# Patient Record
Sex: Female | Born: 1970 | Hispanic: No | Marital: Married | Smoking: Never smoker
Health system: Southern US, Community
[De-identification: ages and names within clinical notes are randomized; demographics above are authoritative.]

## PROBLEM LIST (undated history)

## (undated) DIAGNOSIS — E039 Hypothyroidism, unspecified: Secondary | ICD-10-CM

## (undated) DIAGNOSIS — J209 Acute bronchitis, unspecified: Secondary | ICD-10-CM

## (undated) DIAGNOSIS — R0600 Dyspnea, unspecified: Secondary | ICD-10-CM

## (undated) DIAGNOSIS — D649 Anemia, unspecified: Secondary | ICD-10-CM

## (undated) HISTORY — DX: Acute bronchitis, unspecified: J20.9

## (undated) HISTORY — PX: FRACTURE SURGERY: SHX138

## (undated) HISTORY — PX: TONSILLECTOMY: SUR1361

---

## 2004-07-31 ENCOUNTER — Ambulatory Visit: Payer: Self-pay | Admitting: Internal Medicine

## 2004-12-15 ENCOUNTER — Other Ambulatory Visit: Admission: RE | Admit: 2004-12-15 | Discharge: 2004-12-15 | Payer: Self-pay | Admitting: Obstetrics and Gynecology

## 2006-03-16 ENCOUNTER — Other Ambulatory Visit: Admission: RE | Admit: 2006-03-16 | Discharge: 2006-03-16 | Payer: Self-pay | Admitting: Obstetrics and Gynecology

## 2008-07-03 ENCOUNTER — Inpatient Hospital Stay (HOSPITAL_COMMUNITY): Admission: AD | Admit: 2008-07-03 | Discharge: 2008-07-07 | Payer: Self-pay | Admitting: Obstetrics and Gynecology

## 2008-07-08 ENCOUNTER — Encounter: Admission: RE | Admit: 2008-07-08 | Discharge: 2008-08-07 | Payer: Self-pay | Admitting: Obstetrics and Gynecology

## 2008-08-08 ENCOUNTER — Encounter: Admission: RE | Admit: 2008-08-08 | Discharge: 2008-09-04 | Payer: Self-pay | Admitting: Obstetrics and Gynecology

## 2010-07-14 ENCOUNTER — Ambulatory Visit: Payer: Self-pay | Admitting: Internal Medicine

## 2010-07-14 DIAGNOSIS — J209 Acute bronchitis, unspecified: Secondary | ICD-10-CM

## 2010-07-14 HISTORY — DX: Acute bronchitis, unspecified: J20.9

## 2010-09-20 DIAGNOSIS — N643 Galactorrhea not associated with childbirth: Secondary | ICD-10-CM | POA: Insufficient documentation

## 2010-10-20 NOTE — Assessment & Plan Note (Signed)
Summary: FEVER--SORE THROAT  COUGH  PER SPOUSE D/T-SDR-STC   Vital Signs:  Patient profile:   40 year old female Height:      62.5 inches Weight:      168 pounds BMI:     30.35 Temp:     98.9 degrees F oral Pulse rate:   112 / minute Pulse rhythm:   regular Resp:     16 per minute BP sitting:   114 / 80  (left arm) Cuff size:   regular  Vitals Entered By: Lanier Prude, CMA(AAMA) (July 14, 2010 9:39 AM) CC: fever,sore throat, cough, ear pain X 4 days   CC:  fever, sore throat, cough, and ear pain X 4 days.  History of Present Illness: The patient presents with complaints of sore throat, fever, cough, sinus congestion and drainge of several days duration. Not better with OTC meds. Chest hurts with coughing. Can't sleep due to cough. Muscle aches are present.  The mucus is colored.  Not seen >3 years  Preventive Screening-Counseling & Management  Alcohol-Tobacco     Smoking Status: never  Current Medications (verified): 1)  None  Allergies (verified): No Known Drug Allergies  Past History:  Past Medical History: Unremarkable  Social History: Occupation: Psychiatric nurse Married Never Smoked Smoking Status:  never  Review of Systems       The patient complains of fever and prolonged cough.    Physical Exam  General:  NAD Mouth:  Erythematous throat and intranasal mucosa c/w URI  Lungs:  Normal respiratory effort, chest expands symmetrically. Lungs are clear to auscultation, no crackles or wheezes. Heart:  Normal rate and regular rhythm. S1 and S2 normal without gallop, murmur, click, rub or other extra sounds. Abdomen:  Bowel sounds positive,abdomen soft and non-tender without masses, organomegaly or hernias noted. Skin:  Intact without suspicious lesions or rashes Psych:  Cognition and judgment appear intact. Alert and cooperative with normal attention span and concentration. No apparent delusions, illusions, hallucinations   Impression &  Recommendations:  Problem # 1:  BRONCHITIS, ACUTE (ICD-466.0) Assessment New  Her updated medication list for this problem includes:    Zithromax Z-pak 250 Mg Tabs (Azithromycin) .Marland Kitchen... As dirrected    Promethazine-codeine 6.25-10 Mg/64ml Syrp (Promethazine-codeine) .Marland Kitchen... 5-10 ml by mouth q id as needed cough  Complete Medication List: 1)  Zithromax Z-pak 250 Mg Tabs (Azithromycin) .... As dirrected 2)  Promethazine-codeine 6.25-10 Mg/23ml Syrp (Promethazine-codeine) .... 5-10 ml by mouth q id as needed cough  Patient Instructions: 1)  Use over-the-counter medicines for "cold": Tylenol  650mg  or Advil 400mg  every 6 hours  for fever; Delsym or Robutussin for cough. Mucinex or Mucinex D for congestion. Ricola or Halls for sore throat. Office visit if not better or if worse.  Prescriptions: PROMETHAZINE-CODEINE 6.25-10 MG/5ML SYRP (PROMETHAZINE-CODEINE) 5-10 ml by mouth q id as needed cough  #300 ml x 0   Entered and Authorized by:   Tresa Garter MD   Signed by:   Tresa Garter MD on 07/14/2010   Method used:   Print then Give to Patient   RxID:   [id number] ZITHROMAX Z-PAK 250 MG TABS (AZITHROMYCIN) as dirrected  #1 x 0   Entered and Authorized by:   Tresa Garter MD   Signed by:   Tresa Garter MD on 07/14/2010   Method used:   Print then Give to Patient   RxID:   [id number]    Orders Added: 1)  New Patient Level II [  99202] 

## 2010-11-13 ENCOUNTER — Other Ambulatory Visit: Payer: Self-pay | Admitting: Obstetrics and Gynecology

## 2010-11-13 DIAGNOSIS — N6452 Nipple discharge: Secondary | ICD-10-CM

## 2010-11-18 ENCOUNTER — Ambulatory Visit
Admission: RE | Admit: 2010-11-18 | Discharge: 2010-11-18 | Disposition: A | Discharge status: Home / Self Care | Payer: BC Managed Care – PPO | Source: Ambulatory Visit | Attending: Obstetrics and Gynecology | Admitting: Obstetrics and Gynecology

## 2010-11-18 DIAGNOSIS — N6452 Nipple discharge: Secondary | ICD-10-CM

## 2011-02-02 NOTE — H&P (Signed)
NAMETIFFNY, GEMMER             ACCOUNT NO.:  [id number]   MEDICAL RECORD NO.:  [id number]          PATIENT TYPE:  INP   LOCATION:  9171                          FACILITY:  [facility]   PHYSICIAN:  Osborn Coho, M.D.   DATE OF BIRTH:  1971/06/29   DATE OF ADMISSION:  07/03/2008  DATE OF DISCHARGE:                              HISTORY & PHYSICAL   A 40 year old gravida 1, para 0 at 39-5/7 weeks who presents with  leaking fluid since 11 p.m.  She has some mild contractions and positive  fetal movement.  Pregnancy has been followed by Dr. Pennie Rushing remarkable  for:  1. Language barrier.  2. AMA.  3. Elevated BMI.  4. History of thyroid surgery.  5. Group B strep negative.   ALLERGIES:  None.   OB HISTORY:  None.   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  She has a history of thyroid surgery at age 89, but no medications  required.  She has a history of scoliosis.   SURGICAL HISTORY:  Remarkable for thyroid surgery at age 64.   FAMILY HISTORY:  Remarkable for an uncle with heart disease.  Mother  with hypertension and varicosities.  Mother and grandmother with anemia  nephew with asthma.  Several family members with diabetes.   GENETIC HISTORY:  Remarkable for the patient's age of 85.   SOCIAL HISTORY:  The patient is married to [place]serrat who is involved and  supportive.  She is of the [place] faith.  She denies any alcohol,  tobacco, or drug use.   PRENATAL LABS:  Hemoglobin 12.5 and platelets 205.  Blood type AB  positive, antibody screen negative, sickle cell negative, RPR  nonreactive, rubella immune, hepatitis negative, HIV negative, Pap test  normal, gonorrhea negative, and chlamydia negative.  TSH 0.47.  Cystic  fibrosis negative.   HISTORY OF CURRENT PREGNANCY:  The patient entered care at [redacted] weeks  gestation.  She had first trimester screen that was normal.  She had  some palpitations in second trimester, but declined workup.  She had  ultrasound at 18 weeks that was normal.  Glucola at 26 weeks was 124 and  she was group B strep negative at term.   OBJECTIVE DATA:  VITAL SIGNS:  Stable, afebrile.  HEENT: Within normal limits.  NECK:  Thyroid normal, not enlarged.  CHEST:  Clear to auscultation.  HEART:  Regular rate and rhythm.  ABDOMEN: Gravid.  VERTEX:  Leopold's exam shows a reactive fetal heart rate with  contractions every 3 to 8 minutes.  Cervix is 185, -1, vertex  presentation with clear fluid leaking copiously.  EXTREMITIES:  Within normal limits.   ASSESSMENT:  1. Intrauterine pregnancy at 39-5/7 weeks.  2. Early labor.  3. Spontaneous rupture of membranes.  Clear fluid.   PLAN:  1. Admit per Dr. Su Hilt.  2. Routine MD orders.  3. Expectant management  4. Epidural p.r.n.      Marie L. Williams, C.N.M.      Osborn Coho, M.D.  Electronically Signed    MLW/MEDQ  D:  07/03/2008  T:  07/03/2008  Job:  [id number]

## 2011-02-02 NOTE — Discharge Summary (Signed)
Katie Mclaughlin, Katie Mclaughlin             ACCOUNT NO.:  [id number]   MEDICAL RECORD NO.:  [id number]          PATIENT TYPE:  INP   LOCATION:  9102                          [facility]ILITY:  [facility]   PHYSICIAN:  Hal Morales, M.D.DATE OF BIRTH:  November 17, 1970   DATE OF ADMISSION:  07/03/2008  DATE OF DISCHARGE:  07/07/2008                               DISCHARGE SUMMARY   ADMITTING DIAGNOSES:  1. Intrauterine pregnancy at 39-5/7 weeks.  2. Early labor.  3. Spontaneous rupture of membranes for clear fluid.   DISCHARGE DIAGNOSES:  1. Stable status post a primary low transverse cesarean section      secondary to failure to progress with findings of a viable female      infant by the name of Maximus who was born July 04, 2007, at 1      minute after midnight, weighing 7 pounds 13 ounces (3555 g), weight      21 inches, Apgars 9 at one minute and 9 at five minutes in LOP      presentation.  2. Breast feeding.   HOSPITAL PROCEDURES:  1. Epidural anesthesia.  2. Primary low transverse cesarean section.   HOSPITAL COURSE:  Ms. Petree is a 40 year old gravida 1, para 0 at 73-  5/[redacted] weeks gestation who presents with leaking of fluid since 11:00 p.m.  She reported some mild contractions, good fetal movement.  Pregnancy had  been followed by Dr. Pennie Rushing at [facility].  Pregnancy had  been remarkable for:   1. Language barrier.  2. Advanced maternal age.  3. Elevated BMI.  4. History of thyroid surgery.  5. Group beta strep negative.   At time of admission contractions were every 3-8 minutes.  Cervix was 1  cm, 85%, -1, vertex presentation and was leaking copious amount of clear  fluid.  She was admitted to birthing suite for expectant management per  Dr. Su Hilt and to receive routine orders and an epidural p.r.n.  Just  before around 1:00 a.m. the patient was noted to have two variable  decelerations with nadir to 100 with double contraction pattern.  D  cells resolved with the  usual measures after uterine contractions were  over and IV started and the patient was continued to be observed  closely.  At noon on October 14 the patient was comfortable status post  an epidural.  Her vital signs were stable.  Fetal heart rate was  reassuring.  Cervical exam revealed 3-4 cm, 80%, -1.  Pitocin had been  initiated previously and was to continue.  Around 6:30 p.m. on October  14 the patient had a hot spot with her epidural.  Fetal heart rate was  reassuring.  Cervical exam revealed 6 cm, 100% and -1 station.  IUPC was  placed at that time for improved Pitocin management and epidural was to  be reassessed.  At 10:00 p.m. on October 14 Dr. Pennie Rushing noted that MVUs  had been anywhere from 180-300 but that there had been no consistent 2  hours of adequate contractions.  Cervix remained unchanged at 6 cm.  Fetal heart rate was reactive, however,  the baseline had elevated up to  160s and 170s.  The patient's temperature was 99 degrees.  A C-section  was recommended to the patient for failure to progress.  The risks,  benefits and alternatives were discussed and risks including but not  limited to bleeding, infection and damage to adjacent organs was  reviewed with the patient.  The patient at that time did desire to  continue to wait to see if cervix did progress and plan was made to  recheck cervix at 11:00 p.m.  At 11:00 p.m. the patient continued to be  uncomfortable after anesthesia I guess had been reassessed and redosed.  MVUs greater than 180.  Cervix was 7 cm, 100%, -1.  Fetal heart rate  with recurrent early relief with contractions.  She at that time did  verbalize desire to proceed with a C-section and the patient was prepped  and taken to OR where a primary low transverse cesarean section was  performed secondary to failure to progress by Dr. Dierdre Forth with  Wynelle Bourgeois, certified nurse midwife assisting.  Procedure was  performed under epidural anesthesia.   Findings were a viable female infant  by the name of Maximus at 1 minute after midnight, weighing 7 pounds 13  ounces with Apgars of 9 and 9.  EBL was approximately 750 mL.  Placenta  was to L and D.  The patient tolerated the procedure well and was taken  to PACU in good condition.   Postoperative day #0 the patient was doing well, had minimal pain.  Foley was still in that morning.  On assessment she was breast feeding.  Vital signs were stable.  Her physical exam was within normal limits.  She had scant lochia.  Incision was clean, dry and intact.  Extremities  within normal limits.  Hemoglobin was down to 10.7 from 11.6 and white  blood cell count was up to 13.7 from 7.7 and platelets were 175 down  from 192.  Routine postoperative care was continued.  On postoperative  #1 the patient continued doing well.  Physical exam was within normal  limits.  She was afebrile.  Vital signs were stable and initially were  anticipating possible early discharge on July 06, 2008.  By  postoperative day #2 on October 17, however, the patient was feeling  well but did desire to complete 3 day stay until October 18.  She was  ambulating, voiding and tolerating p.o. liquids and solids without  difficulty, positive flatus, positive BM.  She continued to work on  breast feeding.  Physical exam remained within normal limits.  Vital  signs were stable and she was afebrile.  Bowel sounds were present in  four quadrants.  Abdomen was soft, appropriately tender.  Fundus was  firm below umbilicus, small lochia rubra.  Incision was clean, dry and  intact without redness.  Routine postpartum care continued.  On the  morning of October 18 which was postop day #3 the patient was without  complaints and ready for discharge.  Physical exam remained within  normal limits and she was afebrile with vital signs stable.  She was  discharged home in stable condition and was believed to have benefitted  from hospital  stay.   DISCHARGE MEDICATIONS:  1. Motrin 600 mg p.o. q.6 hours p.r.n. pain.  2. Percocet 5/325 one to two tablets p.o. q.4-6 hours p.r.n. moderate      to severe pain.  3. Daily vitamin 1 tablet p.o. daily.  4.  Stool softener over-the-counter 1 tablet p.o. q.a.m. may repeat      dose in p.m. for constipation while on Percocet or p.r.n.   DISCHARGE FOLLOWUP:  Is to occur at [facility] in 6 weeks or p.r.n.   DISCHARGE INSTRUCTIONS:  Were per [facility] pamphlet.  Warning signs and  symptoms to report were reviewed.     Candice [place], [place]      Hal Morales, M.D.  Electronically Signed   CHS/MEDQ  D:  07/07/2008  T:  07/07/2008  Job:  [id number]

## 2011-02-02 NOTE — Op Note (Signed)
NAMESAVANHA, Katie Mclaughlin             ACCOUNT NO.:  [id number]   MEDICAL RECORD NO.:  [id number]          PATIENT TYPE:  INP   LOCATION:  9102                          [facility]ACILITY:  [facility]   PHYSICIAN:  Hal Morales, M.D.DATE OF BIRTH:  1971-05-07   DATE OF PROCEDURE:  DATE OF DISCHARGE:                               OPERATIVE REPORT   PREOPERATIVE DIAGNOSES:  Intrauterine pregnancy at term, failure to  progress in labor.   POSTOPERATIVE DIAGNOSES:  Intrauterine pregnancy at term, failure to  progress in labor.   OPERATION:  Primary low-transverse cesarean section.   SURGEON:  Vanessa P. Pennie Rushing, MD   FIRST ASSISTANT:  Elby Showers. Mayford Knife, CNM   ANESTHESIA:  Epidural.   ESTIMATED BLOOD LOSS:  750 mL.   COMPLICATIONS:  None.   FINDINGS:  The patient was delivered of a female infant whose name is  Maximus weighing 7 pounds 13 ounces with Apgars of 9 and 9 at 1 and 5  minutes respectively.  Right fundal fibroid approximately 3 cm.  The  tubes and ovaries were normal for the gravid state.   PROCEDURE:  The patient was taken to the operating room after  appropriate identification with her labor epidural and Foley catheter in  place.  She was placed on the operating table in the supine position  with a left lateral tilt.  Her labor epidural was dosed for surgical  anesthesia.  The abdomen was prepped with multiple layers of Betadine  and draped as a sterile field.  After the assurance of adequate surgical  anesthesia, a transverse area suprapubically was infiltrated with 20 mL  of 0.25% Marcaine.  A suprapubic incision was made and the abdomen  opened in layers.  The peritoneum was entered and the bladder blade  placed.  The uterus was incised approximately 2 cm above the  uterovesical fold and that incision taken laterally on either side  bluntly.  The infant was delivered from the occiput transverse position  and after having the nares and pharynx suctioned and the cord clamped  and  cut, was handed off to the awaiting pediatricians.  The appropriate  cord blood was drawn and the placenta allowed to separate from the  uterus and was removed from the operative field.  The uterine incision  was then closed with a running interlocking suture of 0 Vicryl.  An  imbricating suture of 0 Vicryl was placed with adequate hemostasis.  Copious irrigation was carried out.  The abdominal peritoneum was closed  with running suture of 2-0 Vicryl.  Rectus muscles were approximated in  the midline with the figure-of-eight suture of 2-0 Vicryl.  The fascia  was closed with a running suture of 0 Vicryl and reinforced on either  side of midline with figure-of-eight sutures of 0 Vicryl.  The  subcutaneous tissue was made hemostatic with Bovie cautery and  irrigated.  The skin incision was closed with a  subcuticular suture of 3-0 Monocryl.  A sterile dressing was applied  after Steri-Strips had been applied and the patient taken from the  operating room to the recovery room in satisfactory condition having  tolerated the procedure well with sponge and instrument counts correct.  The placenta went to birthing suites.      Hal Morales, M.D.  Electronically Signed     VPH/MEDQ  D:  07/04/2008  T:  07/04/2008  Job:  [id number]

## 2011-03-23 ENCOUNTER — Other Ambulatory Visit (INDEPENDENT_AMBULATORY_CARE_PROVIDER_SITE_OTHER): Payer: BC Managed Care – PPO

## 2011-03-23 DIAGNOSIS — Z0000 Encounter for general adult medical examination without abnormal findings: Secondary | ICD-10-CM

## 2011-03-23 LAB — CBC WITH DIFFERENTIAL/PLATELET
Basophils Relative: 0.3 % (ref 0.0–3.0)
Eosinophils Relative: 3.3 % (ref 0.0–5.0)
Lymphocytes Relative: 28.9 % (ref 12.0–46.0)
Monocytes Relative: 7.9 % (ref 3.0–12.0)
Neutrophils Relative %: 59.6 % (ref 43.0–77.0)
RBC: 4.26 Mil/uL (ref 3.87–5.11)
WBC: 7.7 10*3/uL (ref 4.5–10.5)

## 2011-03-23 LAB — BASIC METABOLIC PANEL
Calcium: 8.9 mg/dL (ref 8.4–10.5)
Chloride: 109 mEq/L (ref 96–112)
Creatinine, Ser: 0.5 mg/dL (ref 0.4–1.2)
GFR: 160.46 mL/min (ref >60.00)

## 2011-03-23 LAB — URINALYSIS
Hgb urine dipstick: NEGATIVE
Nitrite: NEGATIVE
Specific Gravity, Urine: 1.025 (ref 1.000–1.030)
Total Protein, Urine: NEGATIVE
pH: 5.5 (ref 5.0–8.0)

## 2011-03-23 LAB — LIPID PANEL
LDL Cholesterol: 121 mg/dL — ABNORMAL HIGH (ref 0–99)
Total CHOL/HDL Ratio: 4
Triglycerides: 72 mg/dL (ref 0.0–149.0)
VLDL: 14.4 mg/dL (ref 0.0–40.0)

## 2011-03-23 LAB — HEPATIC FUNCTION PANEL
ALT: 21 U/L (ref 0–35)
Bilirubin, Direct: 0.1 mg/dL (ref 0.0–0.3)
Total Bilirubin: 0.4 mg/dL (ref 0.3–1.2)

## 2011-03-25 ENCOUNTER — Encounter: Payer: Self-pay | Admitting: Internal Medicine

## 2011-03-25 ENCOUNTER — Ambulatory Visit (INDEPENDENT_AMBULATORY_CARE_PROVIDER_SITE_OTHER): Payer: BC Managed Care – PPO | Admitting: Internal Medicine

## 2011-03-25 DIAGNOSIS — E0590 Thyrotoxicosis, unspecified without thyrotoxic crisis or storm: Secondary | ICD-10-CM

## 2011-03-25 DIAGNOSIS — G43909 Migraine, unspecified, not intractable, without status migrainosus: Secondary | ICD-10-CM

## 2011-03-25 DIAGNOSIS — N6459 Other signs and symptoms in breast: Secondary | ICD-10-CM

## 2011-03-25 DIAGNOSIS — Z0000 Encounter for general adult medical examination without abnormal findings: Secondary | ICD-10-CM

## 2011-03-25 DIAGNOSIS — N6452 Nipple discharge: Secondary | ICD-10-CM

## 2011-03-25 MED ORDER — SUMATRIPTAN SUCCINATE 100 MG PO TABS: 100.0000 mg | ORAL_TABLET | Freq: Once | ORAL | Status: AC | PRN

## 2011-03-25 MED ORDER — IBUPROFEN 600 MG PO TABS: ORAL_TABLET | ORAL | Status: AC

## 2011-03-25 MED ORDER — PROMETHAZINE HCL 12.5 MG PO TABS: 12.5000 mg | ORAL_TABLET | Freq: Four times a day (QID) | ORAL | Status: AC | PRN

## 2011-04-07 ENCOUNTER — Encounter: Payer: Self-pay | Admitting: Endocrinology

## 2011-04-07 ENCOUNTER — Ambulatory Visit (INDEPENDENT_AMBULATORY_CARE_PROVIDER_SITE_OTHER): Payer: BC Managed Care – PPO | Admitting: Endocrinology

## 2011-04-07 DIAGNOSIS — E0590 Thyrotoxicosis, unspecified without thyrotoxic crisis or storm: Secondary | ICD-10-CM

## 2011-04-07 MED ORDER — METHIMAZOLE 5 MG PO TABS: 5.0000 mg | ORAL_TABLET | Freq: Every day | ORAL | Status: DC

## 2011-04-07 NOTE — Patient Instructions (Addendum)
i have sent a prescription to your pharmacy, to slow down the thyroid. if ever you have fever while taking this medication, stop it and call us, because of the risk of a rare side-effect Please make a follow-up appointment in 6 weeks. In view of your medical condition, you should avoid pregnancy until we have decided it is safe.

## 2011-04-07 NOTE — Progress Notes (Signed)
Subjective:    Patient ID: Katie Mclaughlin, female    DOB: Jul 29, 1971, 40 y.o.   MRN: [id number]  HPI Pt was noted on routine labs to have low tsh.  She says as a child, she had an overactive thyroid, and she was rx'ed with an uncertain type of medication, for a brief time.  Since then, she has not had and dx or rx, until now.  Pt states few mos of slight intermittent "spasm" in the neck, in the context of anxiety.  No assoc neck pain.   Past Medical History  Diagnosis Date  . BRONCHITIS, ACUTE 07/14/2010    No past surgical history on file.  History   Social History  . Marital Status: Married    Spouse Name: N/A    Number of Children: N/A  . Years of Education: N/A   Occupational History  . voice teacher    Social History Main Topics  . Smoking status: Never Smoker   . Smokeless tobacco: Not on file  . Alcohol Use: No  . Drug Use: No  . Sexually Active: Yes   Other Topics Concern  . Not on file   Social History Narrative  . No narrative on file    Current Outpatient Prescriptions on File Prior to Visit  Medication Sig Dispense Refill  . Calcium Carbonate (CALCIUM 500 PO) Take 1 capsule by mouth daily.        . cyanocobalamin 1000 MCG tablet Take 100 mcg by mouth daily.        . Omega-3 Fatty Acids (FISH OIL) 1000 MG CAPS Take 1 capsule by mouth daily.        . SUMAtriptan (IMITREX) 100 MG tablet Take 1 tablet (100 mg total) by mouth once as needed for migraine.  10 tablet  2    No Known Allergies  Family History  Problem Relation Age of Onset  . Diabetes Mother   . Migraines Mother   no thyroid dz BP 122/80  Pulse 78  Temp(Src) 98.8 F (37.1 C) (Oral)  Ht 5\' 4"  (1.626 m)  Wt 170 lb (77.111 kg)  BMI 29.18 kg/m2  SpO2 98%  LMP 03/05/2011    Review of Systems denies hoarseness, double vision, sob, polyuria, excessive diaphoresis, numbness, tremor, and hypoglycemia.  She has difficulty losing weight.  She reports headache, palpitations, easy bruising,  rhinorrhea, and myalgias.  she has intermittent diarrhea.    Objective:   Physical Exam VS: see vs page GEN: no distress HEAD: head: no deformity eyes: no periorbital swelling, no proptosis external nose and ears are normal mouth: no lesion seen NECK: supple, thyroid is not enlarged CHEST WALL: no deformity CV: reg rate and rhythm, no murmur ABD: abdomen is soft, nontender.  no hepatosplenomegaly.  not distended.  no hernia MUSCULOSKELETAL: muscle bulk and strength are grossly normal.  no obvious joint swelling.  gait is normal and steady EXTEMITIES: no deformity.  no ulcer on the feet.  feet are of normal color and temp.  no edema.  There is a small ecchymosis on the left great toe (recent injury) PULSES: dorsalis pedis intact bilat.  no carotid bruit NEURO:  cn 2-12 grossly intact.   readily moves all 4's.  sensation is intact to touch on the feet SKIN:  Normal texture and temperature.  No rash or suspicious lesion is visible.   NODES:  None palpable at the neck PSYCH: alert, oriented x3.  Does not appear anxious nor depressed.   Lab Results  Component  Value Date   TSH 0.05* 03/23/2011  (i discussed with dr plotnikov). Assessment & Plan:  Hyperthyroidism, uncertain etiology.  i discussed with pt and husband.  They want to minimize their expenditures for now, so they request thioinamide rx.  We discussed the causes, risks, and rx options. Neck sxs, not thyroid-related. Palpitations, and other sxs, possibly thyroid-related.

## 2011-04-10 NOTE — Assessment & Plan Note (Signed)
We discussed age appropriate health related issues, including available/recomended screening tests and vaccinations. We discussed a need for adhering to healthy diet and exercise. Labs/EKG were reviewed/ordered. All questions were answered.   

## 2011-04-10 NOTE — Progress Notes (Signed)
  Subjective:    Patient ID: Katie Mclaughlin, female    DOB: [birth year], 40 y.o.   MRN: [id number]  HPI    Review of Systems  Constitutional: Positive for unexpected weight change (wt gain). Negative for fever, chills, diaphoresis, activity change, appetite change and fatigue.  HENT: Negative for hearing loss, ear pain, nosebleeds, congestion, sore throat, facial swelling, rhinorrhea, sneezing, mouth sores, trouble swallowing, neck pain, neck stiffness, postnasal drip, sinus pressure and tinnitus.   Eyes: Negative for pain, discharge, redness, itching and visual disturbance.  Respiratory: Negative for cough, chest tightness, shortness of breath, wheezing and stridor.   Cardiovascular: Negative for chest pain, palpitations and leg swelling.  Gastrointestinal: Negative for nausea, diarrhea, constipation, blood in stool, abdominal distention, anal bleeding and rectal pain.  Genitourinary: Negative for dysuria, urgency, frequency, hematuria, flank pain, vaginal bleeding, vaginal discharge, difficulty urinating, genital sores and pelvic pain.  Musculoskeletal: Negative for back pain, joint swelling, arthralgias and gait problem.  Skin: Negative.  Negative for rash.  Neurological: Positive for headaches. Negative for dizziness, tremors, seizures, syncope, speech difficulty, weakness and numbness.  Hematological: Negative for adenopathy. Does not bruise/bleed easily.  Psychiatric/Behavioral: Negative for suicidal ideas, behavioral problems, sleep disturbance, dysphoric mood and decreased concentration. The patient is not nervous/anxious.        Objective:   Physical Exam  Constitutional: She appears well-developed and well-nourished. No distress.  HENT:  Head: Normocephalic.  Right Ear: External ear normal.  Left Ear: External ear normal.  Nose: Nose normal.  Mouth/Throat: Oropharynx is clear and moist.  Eyes: Conjunctivae are normal. Pupils are equal, round, and reactive to light. Right eye  exhibits no discharge. Left eye exhibits no discharge.  Neck: Normal range of motion. Neck supple. No JVD present. No tracheal deviation present. No thyromegaly present.  Cardiovascular: Normal rate, regular rhythm and normal heart sounds.   Pulmonary/Chest: No stridor. No respiratory distress. She has no wheezes.  Abdominal: Soft. Bowel sounds are normal. She exhibits no distension and no mass. There is no tenderness. There is no rebound and no guarding.  Musculoskeletal: She exhibits no edema and no tenderness.  Lymphadenopathy:    She has no cervical adenopathy.  Neurological: She displays normal reflexes. No cranial nerve deficit. She exhibits normal muscle tone. Coordination normal.  Skin: No rash noted. No erythema.  Psychiatric: She has a normal mood and affect. Her behavior is normal. Judgment and thought content normal.       Procedure Note :    Procedure :   Sonography examination (no charge)   Indication: Elevated TSH   Equipment used: Sonosite M-Turbo with HFL38x/13-6 MHz transducer linear probe. The images were stored in the unit and later transferred in storage.  The patient was placed in a decubitus position.  This study revealed a normal thyroid  Impression: Normal thyroid      Assessment & Plan:

## 2011-04-10 NOTE — Assessment & Plan Note (Signed)
See Meds 

## 2011-04-10 NOTE — Assessment & Plan Note (Signed)
Endocr cons Dr Everardo All

## 2011-05-19 ENCOUNTER — Ambulatory Visit: Payer: BC Managed Care – PPO | Admitting: Endocrinology

## 2011-06-22 LAB — CBC
HCT: 32 — ABNORMAL LOW
HCT: 34.2 — ABNORMAL LOW
Hemoglobin: 10.7 — ABNORMAL LOW
Hemoglobin: 11.6 — ABNORMAL LOW
MCHC: 33.4
MCV: 92.3
RBC: 3.46 — ABNORMAL LOW
RBC: 3.74 — ABNORMAL LOW
WBC: 7.7

## 2011-06-24 ENCOUNTER — Ambulatory Visit (INDEPENDENT_AMBULATORY_CARE_PROVIDER_SITE_OTHER): Payer: BC Managed Care – PPO | Admitting: Internal Medicine

## 2011-06-24 ENCOUNTER — Encounter: Payer: Self-pay | Admitting: Internal Medicine

## 2011-06-24 ENCOUNTER — Other Ambulatory Visit (INDEPENDENT_AMBULATORY_CARE_PROVIDER_SITE_OTHER): Payer: BC Managed Care – PPO

## 2011-06-24 VITALS — BP 120/80 | HR 88 | Temp 99.5°F | Resp 16 | Wt 177.0 lb

## 2011-06-24 DIAGNOSIS — R030 Elevated blood-pressure reading, without diagnosis of hypertension: Secondary | ICD-10-CM

## 2011-06-24 DIAGNOSIS — J209 Acute bronchitis, unspecified: Secondary | ICD-10-CM

## 2011-06-24 DIAGNOSIS — E049 Nontoxic goiter, unspecified: Secondary | ICD-10-CM

## 2011-06-24 DIAGNOSIS — E0590 Thyrotoxicosis, unspecified without thyrotoxic crisis or storm: Secondary | ICD-10-CM

## 2011-06-24 LAB — T4, FREE: Free T4: 0.83 ng/dL (ref 0.60–1.60)

## 2011-06-24 LAB — BASIC METABOLIC PANEL
BUN: 9 mg/dL (ref 6–23)
CO2: 28 mEq/L (ref 19–32)
Chloride: 104 mEq/L (ref 96–112)
Creatinine, Ser: 0.6 mg/dL (ref 0.4–1.2)
Potassium: 4.9 mEq/L (ref 3.5–5.1)

## 2011-06-24 MED ORDER — HYDROCHLOROTHIAZIDE 12.5 MG PO CAPS: 12.5000 mg | ORAL_CAPSULE | Freq: Every day | ORAL | Status: DC

## 2011-06-24 MED ORDER — AZITHROMYCIN 250 MG PO TABS: ORAL_TABLET | ORAL | Status: AC

## 2011-06-24 MED ORDER — METHIMAZOLE 5 MG PO TABS: 5.0000 mg | ORAL_TABLET | Freq: Every day | ORAL | Status: DC

## 2011-06-24 NOTE — Assessment & Plan Note (Signed)
Mild Microzide 1 a day

## 2011-06-24 NOTE — Progress Notes (Signed)
  Subjective:    Patient ID: Katie Mclaughlin, female    DOB: 11/28/1970, 40 y.o.   MRN: [id number]  HPI   HPI  C/o URI sx's x   20 days. C/o ST, cough, weakness. Not better with OTC medicines. Actually, the patient is getting worse  C/o elev SBP at home >130 C/o goiter - asking me to check labs  .Review of Systems  Constitutional: Positive for fever, chills and fatigue.  HENT: Positive for congestion, rhinorrhea, sneezing and postnasal drip.   Eyes: Positive for photophobia and pain. Negative for discharge and visual disturbance.  Respiratory: Positive for cough and wheezing.   Positive for chest pain.  Gastrointestinal: Negative for vomiting, abdominal pain, diarrhea and abdominal distention.  Genitourinary: Negative for dysuria and difficulty urinating.  Skin: Negative for rash.  Neurological: Positive for dizziness, weakness and light-headedness.      Review of Systems     Objective:   Physical Exam  Constitutional: She appears well-developed and well-nourished. No distress.  HENT:  Head: Normocephalic.  Right Ear: External ear normal.  Left Ear: External ear normal.  Nose: Nose normal.  Mouth/Throat: Oropharynx is clear and moist.       eryth throat  Eyes: Conjunctivae are normal. Pupils are equal, round, and reactive to light. Right eye exhibits no discharge. Left eye exhibits no discharge.  Neck: Normal range of motion. Neck supple. No JVD present. No tracheal deviation present. No thyromegaly present.  Cardiovascular: Normal rate, regular rhythm and normal heart sounds.   Pulmonary/Chest: No stridor. No respiratory distress. She has no wheezes.  Abdominal: Soft. Bowel sounds are normal. She exhibits no distension and no mass. There is no tenderness. There is no rebound and no guarding.  Musculoskeletal: She exhibits no edema and no tenderness.  Lymphadenopathy:    She has no cervical adenopathy.  Neurological: She displays normal reflexes. No cranial nerve deficit.  She exhibits normal muscle tone. Coordination normal.  Skin: No rash noted. No erythema.  Psychiatric: She has a normal mood and affect. Her behavior is normal. Judgment and thought content normal.    Lab Results  Component Value Date   WBC 7.7 03/23/2011   HGB 12.4 03/23/2011   HCT 37.0 03/23/2011   PLT 188.0 03/23/2011   GLUCOSE 110* 06/24/2011   CHOL 176 03/23/2011   TRIG 72.0 03/23/2011   HDL 40.90 03/23/2011   LDLCALC 121* 03/23/2011   ALT 21 03/23/2011   AST 15 03/23/2011   NA 140 06/24/2011   K 4.9 06/24/2011   CL 104 06/24/2011   CREATININE 0.6 06/24/2011   BUN 9 06/24/2011   CO2 28 06/24/2011   TSH 2.30 06/24/2011         Assessment & Plan:

## 2011-06-24 NOTE — Assessment & Plan Note (Signed)
Check labs. F/u w/Dr Everardo All Continue with current prescription therapy as reflected on the Med list.

## 2011-06-24 NOTE — Assessment & Plan Note (Signed)
Zpac prescription therapy as reflected on the Med list.

## 2011-06-24 NOTE — Assessment & Plan Note (Signed)
Microzide 

## 2011-06-24 NOTE — Patient Instructions (Signed)
Use over-the-counter  "cold" medicines  such as "Tylenol cold" , "Advil cold",  "Mucinex" or" Mucinex D"  for cough and congestion.   Avoid decongestants if you have high blood pressure and use "Afrin" nasal spray for nasal congestion as directed instead. Use" Delsym" or" Robitussin" cough syrup varietis for cough.  You can use plain "Tylenol" or "Advil" for fever, chills and achyness.  

## 2011-07-08 ENCOUNTER — Encounter: Payer: Self-pay | Admitting: Internal Medicine

## 2011-11-01 ENCOUNTER — Other Ambulatory Visit: Payer: Self-pay | Admitting: Obstetrics and Gynecology

## 2011-11-01 DIAGNOSIS — Z1231 Encounter for screening mammogram for malignant neoplasm of breast: Secondary | ICD-10-CM

## 2011-11-18 ENCOUNTER — Ambulatory Visit: Payer: Self-pay | Admitting: Obstetrics and Gynecology

## 2011-11-22 ENCOUNTER — Other Ambulatory Visit: Payer: Self-pay | Admitting: *Deleted

## 2011-11-22 MED ORDER — METHIMAZOLE 5 MG PO TABS: 5.0000 mg | ORAL_TABLET | Freq: Every day | ORAL | Status: DC

## 2011-12-01 ENCOUNTER — Ambulatory Visit
Admission: RE | Admit: 2011-12-01 | Discharge: 2011-12-01 | Disposition: A | Discharge status: Home / Self Care | Payer: BC Managed Care – PPO | Source: Ambulatory Visit | Attending: Obstetrics and Gynecology | Admitting: Obstetrics and Gynecology

## 2011-12-01 DIAGNOSIS — Z1231 Encounter for screening mammogram for malignant neoplasm of breast: Secondary | ICD-10-CM

## 2011-12-02 ENCOUNTER — Ambulatory Visit: Payer: Self-pay | Admitting: Obstetrics and Gynecology

## 2011-12-02 ENCOUNTER — Ambulatory Visit (INDEPENDENT_AMBULATORY_CARE_PROVIDER_SITE_OTHER): Payer: BC Managed Care – PPO | Admitting: Obstetrics and Gynecology

## 2011-12-02 DIAGNOSIS — Z01419 Encounter for gynecological examination (general) (routine) without abnormal findings: Secondary | ICD-10-CM

## 2011-12-06 ENCOUNTER — Other Ambulatory Visit: Payer: Self-pay | Admitting: Obstetrics and Gynecology

## 2011-12-06 ENCOUNTER — Ambulatory Visit: Payer: BC Managed Care – PPO | Admitting: Obstetrics and Gynecology

## 2011-12-06 DIAGNOSIS — R928 Other abnormal and inconclusive findings on diagnostic imaging of breast: Secondary | ICD-10-CM

## 2011-12-07 ENCOUNTER — Ambulatory Visit
Admission: RE | Admit: 2011-12-07 | Discharge: 2011-12-07 | Disposition: A | Discharge status: Home / Self Care | Payer: BC Managed Care – PPO | Source: Ambulatory Visit | Attending: Obstetrics and Gynecology | Admitting: Obstetrics and Gynecology

## 2011-12-07 DIAGNOSIS — R928 Other abnormal and inconclusive findings on diagnostic imaging of breast: Secondary | ICD-10-CM

## 2012-03-09 ENCOUNTER — Other Ambulatory Visit: Payer: Self-pay | Admitting: Internal Medicine

## 2012-06-13 ENCOUNTER — Other Ambulatory Visit: Payer: Self-pay | Admitting: Internal Medicine

## 2012-06-30 ENCOUNTER — Other Ambulatory Visit: Payer: Self-pay | Admitting: Internal Medicine

## 2012-06-30 DIAGNOSIS — Z0000 Encounter for general adult medical examination without abnormal findings: Secondary | ICD-10-CM

## 2012-09-16 ENCOUNTER — Other Ambulatory Visit: Payer: Self-pay | Admitting: Internal Medicine

## 2012-09-18 ENCOUNTER — Encounter: Payer: Self-pay | Admitting: Internal Medicine

## 2012-09-18 ENCOUNTER — Ambulatory Visit (INDEPENDENT_AMBULATORY_CARE_PROVIDER_SITE_OTHER): Payer: BC Managed Care – PPO | Admitting: Internal Medicine

## 2012-09-18 ENCOUNTER — Other Ambulatory Visit (INDEPENDENT_AMBULATORY_CARE_PROVIDER_SITE_OTHER): Payer: BC Managed Care – PPO

## 2012-09-18 VITALS — BP 110/76 | HR 76 | Temp 98.3°F | Resp 16 | Ht 60.0 in | Wt 173.0 lb

## 2012-09-18 DIAGNOSIS — E0590 Thyrotoxicosis, unspecified without thyrotoxic crisis or storm: Secondary | ICD-10-CM

## 2012-09-18 DIAGNOSIS — Z0000 Encounter for general adult medical examination without abnormal findings: Secondary | ICD-10-CM

## 2012-09-18 DIAGNOSIS — Z23 Encounter for immunization: Secondary | ICD-10-CM

## 2012-09-18 DIAGNOSIS — R030 Elevated blood-pressure reading, without diagnosis of hypertension: Secondary | ICD-10-CM

## 2012-09-18 DIAGNOSIS — M545 Low back pain, unspecified: Secondary | ICD-10-CM | POA: Insufficient documentation

## 2012-09-18 LAB — BASIC METABOLIC PANEL
CO2: 26 mEq/L (ref 19–32)
Calcium: 8.8 mg/dL (ref 8.4–10.5)
Creatinine, Ser: 0.7 mg/dL (ref 0.4–1.2)
GFR: 106.86 mL/min (ref >60.00)
Glucose, Bld: 90 mg/dL (ref 70–99)
Sodium: 140 mEq/L (ref 135–145)

## 2012-09-18 LAB — TSH: TSH: 1.72 u[IU]/mL (ref 0.35–5.50)

## 2012-09-18 LAB — LDL CHOLESTEROL, DIRECT: Direct LDL: 184.8 mg/dL

## 2012-09-18 LAB — URINALYSIS
Leukocytes, UA: NEGATIVE
Nitrite: NEGATIVE
Total Protein, Urine: NEGATIVE
pH: 6 (ref 5.0–8.0)

## 2012-09-18 LAB — HEPATIC FUNCTION PANEL
AST: 16 U/L (ref 0–37)
Alkaline Phosphatase: 57 U/L (ref 39–117)
Total Bilirubin: 0.7 mg/dL (ref 0.3–1.2)

## 2012-09-18 LAB — LIPID PANEL: HDL: 40 mg/dL (ref >39.00)

## 2012-09-18 MED ORDER — METHIMAZOLE 5 MG PO TABS: 5.0000 mg | ORAL_TABLET | Freq: Every day | ORAL | Status: DC

## 2012-09-18 MED ORDER — ELETRIPTAN HYDROBROMIDE 40 MG PO TABS: 40.0000 mg | ORAL_TABLET | ORAL | Status: DC | PRN

## 2012-09-18 NOTE — Assessment & Plan Note (Signed)
resolved 

## 2012-09-18 NOTE — Assessment & Plan Note (Signed)
We discussed age appropriate health related issues, including available/recomended screening tests and vaccinations. We discussed a need for adhering to healthy diet and exercise. Labs/EKG were reviewed/ordered. All questions were answered.   

## 2012-09-18 NOTE — Patient Instructions (Signed)
Wt Readings from Last 3 Encounters:  09/18/12 173 lb (78.472 kg)  06/24/11 177 lb (80.287 kg)  04/07/11 170 lb (77.111 kg)   BP Readings from Last 3 Encounters:  09/18/12 110/76  06/24/11 120/80  04/07/11 122/80

## 2012-09-18 NOTE — Progress Notes (Signed)
   Subjective:    HPI  The patient is here for a wellness exam. The patient has been doing well overall without major physical or psychological issues going on lately.  Wt Readings from Last 3 Encounters:  09/18/12 173 lb (78.472 kg)  06/24/11 177 lb (80.287 kg)  04/07/11 170 lb (77.111 kg)   BP Readings from Last 3 Encounters:  09/18/12 110/76  06/24/11 120/80  04/07/11 122/80         Review of Systems  Constitutional: Positive for unexpected weight change (wt gain). Negative for fever, chills, diaphoresis, activity change, appetite change and fatigue.  HENT: Negative for hearing loss, ear pain, nosebleeds, congestion, sore throat, facial swelling, rhinorrhea, sneezing, mouth sores, trouble swallowing, neck pain, neck stiffness, postnasal drip, sinus pressure and tinnitus.   Eyes: Negative for pain, discharge, redness, itching and visual disturbance.  Respiratory: Negative for cough, chest tightness, shortness of breath, wheezing and stridor.   Cardiovascular: Negative for chest pain, palpitations and leg swelling.  Gastrointestinal: Negative for nausea, diarrhea, constipation, blood in stool, abdominal distention, anal bleeding and rectal pain.  Genitourinary: Negative for dysuria, urgency, frequency, hematuria, flank pain, vaginal bleeding, vaginal discharge, difficulty urinating, genital sores and pelvic pain.  Musculoskeletal: Negative for back pain, joint swelling, arthralgias and gait problem.  Skin: Negative.  Negative for rash.  Neurological: Positive for headaches. Negative for dizziness, tremors, seizures, syncope, speech difficulty, weakness and numbness.  Hematological: Negative for adenopathy. Does not bruise/bleed easily.  Psychiatric/Behavioral: Negative for suicidal ideas, behavioral problems, sleep disturbance, dysphoric mood and decreased concentration. The patient is not nervous/anxious.        Objective:   Physical Exam  Constitutional: She appears  well-developed and well-nourished. No distress.  HENT:  Head: Normocephalic.  Right Ear: External ear normal.  Left Ear: External ear normal.  Nose: Nose normal.  Mouth/Throat: Oropharynx is clear and moist.  Eyes: Conjunctivae normal are normal. Pupils are equal, round, and reactive to light. Right eye exhibits no discharge. Left eye exhibits no discharge.  Neck: Normal range of motion. Neck supple. No JVD present. No tracheal deviation present. No thyromegaly present.  Cardiovascular: Normal rate, regular rhythm and normal heart sounds.   Pulmonary/Chest: No stridor. No respiratory distress. She has no wheezes.  Abdominal: Soft. Bowel sounds are normal. She exhibits no distension and no mass. There is no tenderness. There is no rebound and no guarding.  Musculoskeletal: She exhibits no edema and no tenderness.  Lymphadenopathy:    She has no cervical adenopathy.  Neurological: She displays normal reflexes. No cranial nerve deficit. She exhibits normal muscle tone. Coordination normal.  Skin: No rash noted. No erythema.  Psychiatric: She has a normal mood and affect. Her behavior is normal. Judgment and thought content normal.  LS is sensitive I do not see scoliosis being evident Lab Results  Component Value Date   WBC 7.7 03/23/2011   HGB 12.4 03/23/2011   HCT 37.0 03/23/2011   PLT 188.0 03/23/2011   GLUCOSE 110* 06/24/2011   CHOL 176 03/23/2011   TRIG 72.0 03/23/2011   HDL 40.90 03/23/2011   LDLCALC 121* 03/23/2011   ALT 21 03/23/2011   AST 15 03/23/2011   NA 140 06/24/2011   K 4.9 06/24/2011   CL 104 06/24/2011   CREATININE 0.6 06/24/2011   BUN 9 06/24/2011   CO2 28 06/24/2011   TSH 2.30 06/24/2011          Assessment & Plan:

## 2012-09-19 LAB — CBC WITH DIFFERENTIAL/PLATELET
Eosinophils Relative: 1.4 % (ref 0.0–5.0)
HCT: 36.7 % (ref 36.0–46.0)
Hemoglobin: 12.1 g/dL (ref 12.0–15.0)
Lymphs Abs: 2.3 10*3/uL (ref 0.7–4.0)
MCV: 85.1 fl (ref 78.0–100.0)
Monocytes Absolute: 0.5 10*3/uL (ref 0.1–1.0)
Neutro Abs: 4.3 10*3/uL (ref 1.4–7.7)
Platelets: 221 10*3/uL (ref 150.0–400.0)
RDW: 15 % — ABNORMAL HIGH (ref 11.5–14.6)
WBC: 7.2 10*3/uL (ref 4.5–10.5)

## 2012-09-20 NOTE — Assessment & Plan Note (Signed)
Labs  Endocr f/u

## 2012-09-20 NOTE — Assessment & Plan Note (Signed)
Sports Med consult Yoga suggested She declined X rays

## 2012-09-22 ENCOUNTER — Telehealth: Payer: Self-pay | Admitting: Internal Medicine

## 2012-09-22 NOTE — Telephone Encounter (Signed)
Pt decline appt to sports medicine.

## 2012-09-22 NOTE — Telephone Encounter (Signed)
Noted. Thx.

## 2012-09-25 ENCOUNTER — Ambulatory Visit: Payer: BC Managed Care – PPO | Admitting: Internal Medicine

## 2012-09-28 ENCOUNTER — Ambulatory Visit: Payer: BC Managed Care – PPO | Admitting: Internal Medicine

## 2012-10-10 ENCOUNTER — Ambulatory Visit: Payer: BC Managed Care – PPO | Admitting: Sports Medicine

## 2012-11-14 ENCOUNTER — Other Ambulatory Visit: Payer: Self-pay | Admitting: Obstetrics and Gynecology

## 2012-11-22 ENCOUNTER — Ambulatory Visit: Payer: BC Managed Care – PPO | Admitting: Internal Medicine

## 2012-12-13 ENCOUNTER — Encounter: Payer: Self-pay | Admitting: Internal Medicine

## 2012-12-13 ENCOUNTER — Ambulatory Visit
Admission: RE | Admit: 2012-12-13 | Discharge: 2012-12-13 | Disposition: A | Discharge status: Home / Self Care | Payer: BC Managed Care – PPO | Source: Ambulatory Visit | Attending: Obstetrics and Gynecology | Admitting: Obstetrics and Gynecology

## 2012-12-13 ENCOUNTER — Ambulatory Visit (INDEPENDENT_AMBULATORY_CARE_PROVIDER_SITE_OTHER): Payer: BC Managed Care – PPO | Admitting: Internal Medicine

## 2012-12-13 VITALS — BP 108/68 | HR 70 | Temp 98.1°F | Resp 10 | Ht 62.5 in | Wt 175.0 lb

## 2012-12-13 DIAGNOSIS — E0590 Thyrotoxicosis, unspecified without thyrotoxic crisis or storm: Secondary | ICD-10-CM

## 2012-12-13 DIAGNOSIS — Z1231 Encounter for screening mammogram for malignant neoplasm of breast: Secondary | ICD-10-CM

## 2012-12-13 LAB — TSH: TSH: 1.27 u[IU]/mL (ref 0.35–5.50)

## 2012-12-13 LAB — T4, FREE: Free T4: 0.84 ng/dL (ref 0.60–1.60)

## 2012-12-13 NOTE — Progress Notes (Signed)
Subjective:     Patient ID: Katie Mclaughlin, female   DOB: 08-03-1971, 42 y.o.   MRN: [id number]  HPI Katie Mclaughlin is a pleasant 42 year old [place] woman, referred by her PCP, Dr. Posey Rea, for management of hyperthyroidism.she is here with her husband who translates for [place] (she does understand and speaks a little [place]) and her son who is 85 years old.  She was dx with hyperthyroidism at 42 y/o >> was treated with RAI ablation, which apparently worked >> but she did not follow up after this. In 2012, as a part of an annual physical exam, a TSH was checked and it returned low, at 0.05 >> she was referred to Dr. Everardo All who started her on MMI 5 mg daily, which she is taking since then. She feels tightness in her neck, but this was worse before, in 2012, when she saw Dr. Everardo All. Of note, Dr. Posey Rea performed a Thyroid U/S in 2012, showing no nodule.  Previous TSH levels: TSH  Date Value Range Status  09/18/2012 1.72  0.35 - 5.50 uIU/mL Final  06/24/2011 2.30  0.35 - 5.50 uIU/mL Final  03/23/2011 0.05* 0.35 - 5.50 uIU/mL Final   She does note that she is not feeling well (palpitations?) when she forgets to take the methimazole. She has weight gain: 15-20 lbs since 2010 after the birth of her son, but fluctuating weight. 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 He does not have heat intolerance, on the contrary, she feels mostly cold. She does complain of fatigue.  No FH of thyroid or AI ds.   Review of Systems please see history of present illness, and also Constitutional: weight gain, fatigue, cold intolerance Eyes: no blurry vision, no xerophthalmia ENT: no sore throat, no nodules palpated in throat, no dysphagia/odynophagia, no hoarseness Cardiovascular: no CP/SOB/palpitations/leg swelling Respiratory: no cough/SOB Gastrointestinal: no N/V/D/C Musculoskeletal: no muscle/has joint aches Skin: no rashes, Has easy bruising, hair  loss Neurological: no tremors/numbness/tingling/dizziness, has headaches Psychiatric: no depression/anxiety  Past Medical History  Diagnosis Date  . BRONCHITIS, ACUTE 07/14/2010   History reviewed. No pertinent past surgical history.  History   Social History  . Marital Status: Married    Spouse Name: N/A    Number of Children: N/A  . Years of Education: N/A   Occupational History  . voice teacher    Social History Main Topics  . Smoking status: Never Smoker   . Smokeless tobacco: Not on file  . Alcohol Use: No  . Drug Use: No  . Sexually Active: Yes   Other Topics Concern  . Not on file   Social History Narrative  . No narrative on file   Current Outpatient Prescriptions on File Prior to Visit  Medication Sig Dispense Refill  . Calcium Carbonate (CALCIUM 500 PO) Take 1 capsule by mouth daily.        . cyanocobalamin 1000 MCG tablet Take 100 mcg by mouth daily.        Marland Kitchen eletriptan (RELPAX) 40 MG tablet One tablet by mouth at onset of headache. May repeat in 2 hours if headache persists or recurs. may repeat in 2 hours if necessary  10 tablet  6  . methimazole (TAPAZOLE) 5 MG tablet Take 1 tablet (5 mg total) by mouth daily.  90 tablet  3  . Omega-3 Fatty Acids (FISH OIL) 1000 MG CAPS Take 1 capsule by mouth daily.        Marland Kitchen  hydrochlorothiazide (MICROZIDE) 12.5 MG capsule Take 12.5 mg by mouth daily.       No current facility-administered medications on file prior to visit.   No Known Allergies  Family History  Problem Relation Age of Onset  . Diabetes Mother   . Migraines Mother    Objective:   Physical Exam BP 108/68  Pulse 70  Temp(Src) 98.1 F (36.7 C) (Oral)  Resp 10  Ht 5' 2.5" (1.588 m)  Wt 175 lb (79.379 kg)  BMI 31.48 kg/m2  SpO2 97%  LMP 11/18/2012 Wt Readings from Last 3 Encounters:  12/13/12 175 lb (79.379 kg)  09/18/12 173 lb (78.472 kg)  06/24/11 177 lb (80.287 kg)   Constitutional: overweight, in NAD Eyes: PERRLA, EOMI, no  exophthalmos ENT: moist mucous membranes, slight thyromegaly, possible left-sided thyroid nodule, approximately 1 cm diameter, no cervical lymphadenopathy Cardiovascular: RRR, No MRG Respiratory: CTA B Gastrointestinal: abdomen soft, NT, ND, BS+ Musculoskeletal: no deformities, strength intact in all 4 Skin: moist, warm, no rashes Neurological: no tremor with outstretched hands, DTR normal in all 4  Assessment:     1. Hyperthyroidism - On methimazole 5 mg daily for the last 2 years - Status post radioactive iodine ablation at 42 years old     Plan:     I discussed with the patient and her husband about possible causes for her hyperthyroidism and directions for therapy. I doubt the patient has thyroiditis, due to the long course of her hyperthyroidism. It is possible that she has recurrent Graves' disease, especially with her history of radioactive iodine treatment as a child. Also, on exam, I feel that she might have a left-sided thyroid nodule is possible that this is active.  - We have to be mindful of cost for all of the tests and visits. Patient's husband tells me that they only have 3 doctor's visits paid by the insurance, and we are not sure how the imaging tests will be covered. - I suggested that we do the following: Today we'll check a TSH, free T4, and TSI Depending on the results, you may need to titrate down the Methimazole as follows: - 2 weeks at 2.5 mg daily - 2 weeks at 1.25 mg daily (you can cut a tablet in 4 or take half a tablet every other day) Then stop for 4 weeks and return for labs  - if new TSH returns low, then we will proceed with a thyroid uptake and scan  - if there is a nodule and this is hyperactive "hot", we will need to do radioactive iodine ablation - if there is no nodule, we can either do radioactive iodine ablation or continue methimazole 5 mg daily - if there is a nodule and is hypoactive "cold", we will need to do a thyroid ultrasound  - the fact  that she might have a thyroid nodule complicates the plan. I initially intended to get the TSIs, and, if positive, to continue her methimazole since it is very likely that she has Graves' disease, and she is less keen on a repeat radioactive iodine treatment - However, a possible thyroid nodule will mandate either a thyroid uptake and scan (if her TSH returned low after stopping the methimazole) or a thyroid ultrasound (if her TSH is normal after stopping the methimazole). If there is no nodule, then we can go ahead with the initial plan to restart methimazole. - I would see the patient back in 4 months  Office Visit on 12/13/2012  Component Date  Value Range Status  . TSH 12/13/2012 1.27  0.35 - 5.50 uIU/mL Final  . Free T4 12/13/2012 0.84  0.60 - 1.60 ng/dL Final  . TSI 16/06/9603 53  <140 % baseline Final   Will send pt a message to taper off MMI as advised.

## 2012-12-13 NOTE — Patient Instructions (Addendum)
Please join MyChart. I will send you the results of the tests from today through MyChart. Depending on the results, you may need to titrate down the Methimazole as follows: - 2 weeks at 2.5 mg daily - 2 weeks at 1.25 mg daily (you can cut a tablet in 4 or take half a tablet every other day) - then stop for 4 weeks and return for labs  - if new TSH returns low, then we will proceed with a thyroid uptake and scan  - if there is a nodule and this is hyperactive "hot", we will need to do radioactive iodine ablation - if there is no nodule, we can either do radioactive iodine ablation or continue methimazole 5 mg daily - if there is a nodule and is hypoactive "cold", we will need to do a thyroid ultrasound

## 2012-12-16 LAB — THYROID STIMULATING IMMUNOGLOBULIN: TSI: 53 %{baseline}

## 2013-04-16 ENCOUNTER — Ambulatory Visit: Payer: BC Managed Care – PPO | Admitting: Internal Medicine

## 2013-07-26 ENCOUNTER — Other Ambulatory Visit: Payer: Self-pay

## 2013-11-09 ENCOUNTER — Other Ambulatory Visit: Payer: Self-pay

## 2013-11-09 DIAGNOSIS — Z1231 Encounter for screening mammogram for malignant neoplasm of breast: Secondary | ICD-10-CM

## 2013-11-18 LAB — HM PAP SMEAR

## 2013-12-17 ENCOUNTER — Ambulatory Visit
Admission: RE | Admit: 2013-12-17 | Discharge: 2013-12-17 | Disposition: A | Discharge status: Home / Self Care | Payer: BC Managed Care – PPO | Source: Ambulatory Visit

## 2013-12-17 DIAGNOSIS — Z1231 Encounter for screening mammogram for malignant neoplasm of breast: Secondary | ICD-10-CM

## 2014-08-30 ENCOUNTER — Other Ambulatory Visit (INDEPENDENT_AMBULATORY_CARE_PROVIDER_SITE_OTHER): Payer: BC Managed Care – PPO

## 2014-08-30 ENCOUNTER — Ambulatory Visit (INDEPENDENT_AMBULATORY_CARE_PROVIDER_SITE_OTHER): Payer: BC Managed Care – PPO | Admitting: Internal Medicine

## 2014-08-30 ENCOUNTER — Encounter: Payer: Self-pay | Admitting: Internal Medicine

## 2014-08-30 VITALS — BP 108/70 | HR 78 | Temp 98.6°F | Ht 62.5 in | Wt 177.0 lb

## 2014-08-30 DIAGNOSIS — Z0000 Encounter for general adult medical examination without abnormal findings: Secondary | ICD-10-CM

## 2014-08-30 DIAGNOSIS — R030 Elevated blood-pressure reading, without diagnosis of hypertension: Secondary | ICD-10-CM

## 2014-08-30 DIAGNOSIS — B079 Viral wart, unspecified: Secondary | ICD-10-CM

## 2014-08-30 DIAGNOSIS — E0590 Thyrotoxicosis, unspecified without thyrotoxic crisis or storm: Secondary | ICD-10-CM

## 2014-08-30 DIAGNOSIS — IMO0001 Reserved for inherently not codable concepts without codable children: Secondary | ICD-10-CM

## 2014-08-30 LAB — BASIC METABOLIC PANEL
BUN: 11 mg/dL (ref 6–23)
CHLORIDE: 102 meq/L (ref 96–112)
CO2: 25 meq/L (ref 19–32)
CREATININE: 0.6 mg/dL (ref 0.4–1.2)
Calcium: 9.2 mg/dL (ref 8.4–10.5)
GFR: 107.76 mL/min (ref >60.00)
GLUCOSE: 89 mg/dL (ref 70–99)
Potassium: 4.5 mEq/L (ref 3.5–5.1)
Sodium: 135 mEq/L (ref 135–145)

## 2014-08-30 LAB — URINALYSIS, ROUTINE W REFLEX MICROSCOPIC
BILIRUBIN URINE: NEGATIVE
KETONES UR: NEGATIVE
Leukocytes, UA: NEGATIVE
Nitrite: NEGATIVE
PH: 6.5 (ref 5.0–8.0)
Specific Gravity, Urine: 1.025 (ref 1.000–1.030)
Total Protein, Urine: NEGATIVE
UROBILINOGEN UA: 0.2 (ref 0.0–1.0)
Urine Glucose: NEGATIVE

## 2014-08-30 LAB — CBC WITH DIFFERENTIAL/PLATELET
BASOS ABS: 0 10*3/uL (ref 0.0–0.1)
BASOS PCT: 0.3 % (ref 0.0–3.0)
EOS ABS: 0.2 10*3/uL (ref 0.0–0.7)
Eosinophils Relative: 2 % (ref 0.0–5.0)
HCT: 40.8 % (ref 36.0–46.0)
Hemoglobin: 13.5 g/dL (ref 12.0–15.0)
LYMPHS PCT: 25.4 % (ref 12.0–46.0)
Lymphs Abs: 2 10*3/uL (ref 0.7–4.0)
MCHC: 33.1 g/dL (ref 30.0–36.0)
MCV: 88.5 fl (ref 78.0–100.0)
MONO ABS: 0.4 10*3/uL (ref 0.1–1.0)
Monocytes Relative: 5.7 % (ref 3.0–12.0)
NEUTROS ABS: 5.2 10*3/uL (ref 1.4–7.7)
Neutrophils Relative %: 66.6 % (ref 43.0–77.0)
Platelets: 236 10*3/uL (ref 150.0–400.0)
RBC: 4.61 Mil/uL (ref 3.87–5.11)
RDW: 14.3 % (ref 11.5–15.5)
WBC: 7.9 10*3/uL (ref 4.0–10.5)

## 2014-08-30 LAB — HEPATIC FUNCTION PANEL
ALT: 18 U/L (ref 0–35)
AST: 16 U/L (ref 0–37)
Albumin: 4 g/dL (ref 3.5–5.2)
Alkaline Phosphatase: 55 U/L (ref 39–117)
BILIRUBIN DIRECT: 0.1 mg/dL (ref 0.0–0.3)
Total Bilirubin: 0.5 mg/dL (ref 0.2–1.2)
Total Protein: 7.2 g/dL (ref 6.0–8.3)

## 2014-08-30 LAB — T4, FREE: Free T4: 1.08 ng/dL (ref 0.60–1.60)

## 2014-08-30 LAB — LIPID PANEL
CHOL/HDL RATIO: 6
Cholesterol: 219 mg/dL — ABNORMAL HIGH (ref 0–200)
HDL: 35.4 mg/dL — AB (ref >39.00)
LDL CALC: 159 mg/dL — AB (ref 0–99)
NonHDL: 183.6
TRIGLYCERIDES: 125 mg/dL (ref 0.0–149.0)
VLDL: 25 mg/dL (ref 0.0–40.0)

## 2014-08-30 LAB — TSH: TSH: 0.71 u[IU]/mL (ref 0.35–4.50)

## 2014-08-30 MED ORDER — ELETRIPTAN HYDROBROMIDE 40 MG PO TABS: 40.0000 mg | ORAL_TABLET | ORAL | Status: DC | PRN

## 2014-08-30 NOTE — Assessment & Plan Note (Signed)
We discussed age appropriate health related issues, including available/recomended screening tests and vaccinations. We discussed a need for adhering to healthy diet and exercise. Labs/EKG were reviewed/ordered. All questions were answered.   

## 2014-08-30 NOTE — Progress Notes (Signed)
Pre visit review using our clinic review tool, if applicable. No additional management support is needed unless otherwise documented below in the visit note. 

## 2014-08-30 NOTE — Patient Instructions (Signed)
   Postprocedure instructions :     Keep the wounds clean. You can wash them with liquid soap and water. Pat dry with gauze or a Kleenex tissue  Before applying antibiotic ointment and a Band-Aid.   You need to report immediately  if  any signs of infection develop.    

## 2014-08-30 NOTE — Progress Notes (Signed)
Subjective:    HPI  The patient is here for a wellness exam. The patient has been doing well overall without major physical or psychological issues going on lately.  Wt Readings from Last 3 Encounters:  08/30/14 177 lb (80.287 kg)  12/13/12 175 lb (79.379 kg)  09/18/12 173 lb (78.472 kg)   BP Readings from Last 3 Encounters:  08/30/14 108/70  12/13/12 108/68  09/18/12 110/76     Review of Systems  Constitutional: Positive for unexpected weight change (wt gain). Negative for fever, chills, diaphoresis, activity change, appetite change and fatigue.  HENT: Negative for congestion, ear pain, facial swelling, hearing loss, mouth sores, nosebleeds, postnasal drip, rhinorrhea, sinus pressure, sneezing, sore throat, tinnitus and trouble swallowing.   Eyes: Negative for pain, discharge, redness, itching and visual disturbance.  Respiratory: Negative for cough, chest tightness, shortness of breath, wheezing and stridor.   Cardiovascular: Negative for chest pain, palpitations and leg swelling.  Gastrointestinal: Negative for nausea, diarrhea, constipation, blood in stool, abdominal distention, anal bleeding and rectal pain.  Genitourinary: Negative for dysuria, urgency, frequency, hematuria, flank pain, vaginal bleeding, vaginal discharge, difficulty urinating, genital sores and pelvic pain.  Musculoskeletal: Negative for back pain, joint swelling, arthralgias, gait problem, neck pain and neck stiffness.  Skin: Negative.  Negative for rash.  Neurological: Positive for headaches. Negative for dizziness, tremors, seizures, syncope, speech difficulty, weakness and numbness.  Hematological: Negative for adenopathy. Does not bruise/bleed easily.  Psychiatric/Behavioral: Negative for suicidal ideas, behavioral problems, sleep disturbance, dysphoric mood and decreased concentration. The patient is not nervous/anxious.        Objective:   Physical Exam  Constitutional: She appears  well-developed. No distress.  HENT:  Head: Normocephalic.  Right Ear: External ear normal.  Left Ear: External ear normal.  Nose: Nose normal.  Mouth/Throat: Oropharynx is clear and moist.  Eyes: Conjunctivae are normal. Pupils are equal, round, and reactive to light. Right eye exhibits no discharge. Left eye exhibits no discharge.  Neck: Normal range of motion. Neck supple. No JVD present. No tracheal deviation present. No thyromegaly present.  Cardiovascular: Normal rate, regular rhythm and normal heart sounds.   Pulmonary/Chest: No stridor. No respiratory distress. She has no wheezes.  Abdominal: Soft. Bowel sounds are normal. She exhibits no distension and no mass. There is no tenderness. There is no rebound and no guarding.  Musculoskeletal: She exhibits no edema or tenderness.  Lymphadenopathy:    She has no cervical adenopathy.  Neurological: She displays normal reflexes. No cranial nerve deficit. She exhibits normal muscle tone. Coordination normal.  Skin: No rash noted. No erythema.  Psychiatric: She has a normal mood and affect. Her behavior is normal. Judgment and thought content normal.   LS is sensitive I do not see scoliosis being evident Wart L temple  Lab Results  Component Value Date   WBC 7.2 09/18/2012   HGB 12.1 09/18/2012   HCT 36.7 09/18/2012   PLT 221.0 09/18/2012   GLUCOSE 90 09/18/2012   CHOL 225* 09/18/2012   TRIG 65.0 09/18/2012   HDL 40.00 09/18/2012   LDLDIRECT 184.8 09/18/2012   LDLCALC 121* 03/23/2011   ALT 16 09/18/2012   AST 16 09/18/2012   NA 140 09/18/2012   K 3.7 09/18/2012   CL 104 09/18/2012   CREATININE 0.7 09/18/2012   BUN 10 09/18/2012   CO2 26 09/18/2012   TSH 1.27 12/13/2012       Procedure Note :     Procedure : Cryosurgery   Indication:  1 Wart(s)  Actinic keratosis(es)   Risks including unsuccessful procedure , bleeding, infection, bruising, scar, a need for a repeat  procedure and others were explained to the patient in  detail as well as the benefits. Informed consent was obtained verbally.    1 lesion(s)  on L temple   was/were treated with liquid nitrogen on a Q-tip in a usual fasion . Band-Aid was applied and antibiotic ointment was given for a later use.   Tolerated well. Complications none.   Postprocedure instructions :     Keep the wounds clean. You can wash them with liquid soap and water. Pat dry with gauze or a Kleenex tissue  Before applying antibiotic ointment and a Band-Aid.   You need to report immediately  if  any signs of infection develop.       Assessment & Plan:

## 2014-08-30 NOTE — Assessment & Plan Note (Signed)
Resolved

## 2014-08-30 NOTE — Assessment & Plan Note (Signed)
Off meds x 1 year Labs

## 2014-10-17 ENCOUNTER — Other Ambulatory Visit: Payer: Self-pay

## 2014-10-17 DIAGNOSIS — Z1231 Encounter for screening mammogram for malignant neoplasm of breast: Secondary | ICD-10-CM

## 2014-12-19 ENCOUNTER — Ambulatory Visit
Admission: RE | Admit: 2014-12-19 | Discharge: 2014-12-19 | Disposition: A | Discharge status: Home / Self Care | Payer: BLUE CROSS/BLUE SHIELD | Source: Ambulatory Visit

## 2014-12-19 DIAGNOSIS — Z1231 Encounter for screening mammogram for malignant neoplasm of breast: Secondary | ICD-10-CM

## 2015-06-24 ENCOUNTER — Ambulatory Visit (INDEPENDENT_AMBULATORY_CARE_PROVIDER_SITE_OTHER): Payer: BLUE CROSS/BLUE SHIELD | Admitting: Internal Medicine

## 2015-06-24 ENCOUNTER — Other Ambulatory Visit (INDEPENDENT_AMBULATORY_CARE_PROVIDER_SITE_OTHER): Payer: BLUE CROSS/BLUE SHIELD

## 2015-06-24 ENCOUNTER — Encounter: Payer: Self-pay | Admitting: Internal Medicine

## 2015-06-24 VITALS — BP 100/70 | HR 74 | Wt 176.0 lb

## 2015-06-24 DIAGNOSIS — E0590 Thyrotoxicosis, unspecified without thyrotoxic crisis or storm: Secondary | ICD-10-CM

## 2015-06-24 DIAGNOSIS — G43009 Migraine without aura, not intractable, without status migrainosus: Secondary | ICD-10-CM

## 2015-06-24 DIAGNOSIS — Z111 Encounter for screening for respiratory tuberculosis: Secondary | ICD-10-CM

## 2015-06-24 DIAGNOSIS — Z23 Encounter for immunization: Secondary | ICD-10-CM

## 2015-06-24 LAB — CBC WITH DIFFERENTIAL/PLATELET
Basophils Absolute: 0.1 10*3/uL (ref 0.0–0.1)
Basophils Relative: 0.6 % (ref 0.0–3.0)
EOS ABS: 0.2 10*3/uL (ref 0.0–0.7)
EOS PCT: 2.5 % (ref 0.0–5.0)
HCT: 39.3 % (ref 36.0–46.0)
Hemoglobin: 12.9 g/dL (ref 12.0–15.0)
LYMPHS ABS: 2.6 10*3/uL (ref 0.7–4.0)
Lymphocytes Relative: 28.9 % (ref 12.0–46.0)
MCHC: 32.9 g/dL (ref 30.0–36.0)
MCV: 87.9 fl (ref 78.0–100.0)
MONO ABS: 0.7 10*3/uL (ref 0.1–1.0)
Monocytes Relative: 7.2 % (ref 3.0–12.0)
NEUTROS PCT: 60.8 % (ref 43.0–77.0)
Neutro Abs: 5.5 10*3/uL (ref 1.4–7.7)
Platelets: 233 10*3/uL (ref 150.0–400.0)
RBC: 4.47 Mil/uL (ref 3.87–5.11)
RDW: 14.6 % (ref 11.5–15.5)
WBC: 9.1 10*3/uL (ref 4.0–10.5)

## 2015-06-24 MED ORDER — VITAMIN D3 50 MCG (2000 UT) PO CAPS: 2000.0000 [IU] | ORAL_CAPSULE | Freq: Every day | ORAL | Status: DC

## 2015-06-24 MED ORDER — FLUOXETINE HCL 10 MG PO TABS: 10.0000 mg | ORAL_TABLET | Freq: Every day | ORAL | Status: DC

## 2015-06-24 MED ORDER — ELETRIPTAN HYDROBROMIDE 40 MG PO TABS: 40.0000 mg | ORAL_TABLET | ORAL | Status: DC | PRN

## 2015-06-24 NOTE — Progress Notes (Signed)
Subjective:  Patient ID: Katie Mclaughlin, female    DOB: 04-Feb-1971  Age: 44 y.o. MRN: [id number]  CC: No chief complaint on file.   HPI Katie Mclaughlin presents for hyperthyroidism f/u. C/o HA that starts in the neck. C/o being tearful, upset, overwhelmed. Pt is working a lot  Outpatient Prescriptions Prior to Visit  Medication Sig Dispense Refill  . Calcium Carbonate (CALCIUM 500 PO) Take 1 capsule by mouth daily.      Marland Kitchen eletriptan (RELPAX) 40 MG tablet Take 1 tablet (40 mg total) by mouth as needed for migraine. may repeat in 2 hours if necessary 10 tablet 6  . Omega-3 Fatty Acids (FISH OIL) 1000 MG CAPS Take 1 capsule by mouth daily.      . methimazole (TAPAZOLE) 5 MG tablet Take 1 tablet (5 mg total) by mouth daily. (Patient not taking: Reported on 06/24/2015) 90 tablet 3   No facility-administered medications prior to visit.    ROS Review of Systems  Constitutional: Negative for chills, activity change, appetite change, fatigue and unexpected weight change.  HENT: Negative for congestion, mouth sores and sinus pressure.   Eyes: Negative for visual disturbance.  Respiratory: Negative for cough and chest tightness.   Gastrointestinal: Negative for nausea and abdominal pain.  Genitourinary: Negative for frequency, difficulty urinating and vaginal pain.  Musculoskeletal: Positive for back pain and neck pain. Negative for gait problem.  Skin: Negative for pallor and rash.  Neurological: Negative for dizziness, tremors, weakness, numbness and headaches.  Psychiatric/Behavioral: Positive for dysphoric mood and decreased concentration. Negative for confusion and sleep disturbance. The patient is nervous/anxious.   PMS sx's  Objective:  BP 100/70 mmHg  Pulse 74  Wt 176 lb (79.833 kg)  SpO2 97%  BP Readings from Last 3 Encounters:  06/24/15 100/70  08/30/14 108/70  12/13/12 108/68    Wt Readings from Last 3 Encounters:  06/24/15 176 lb (79.833 kg)  08/30/14 177 lb (80.287  kg)  12/13/12 175 lb (79.379 kg)    Physical Exam  Constitutional: She appears well-developed. No distress.  HENT:  Head: Normocephalic.  Right Ear: External ear normal.  Left Ear: External ear normal.  Nose: Nose normal.  Mouth/Throat: Oropharynx is clear and moist.  Eyes: Conjunctivae are normal. Pupils are equal, round, and reactive to light. Right eye exhibits no discharge. Left eye exhibits no discharge.  Neck: Normal range of motion. Neck supple. No JVD present. No tracheal deviation present. No thyromegaly present.  Cardiovascular: Normal rate, regular rhythm and normal heart sounds.   Pulmonary/Chest: No stridor. No respiratory distress. She has no wheezes.  Abdominal: Soft. Bowel sounds are normal. She exhibits no distension and no mass. There is no tenderness. There is no rebound and no guarding.  Musculoskeletal: She exhibits tenderness. She exhibits no edema.  Lymphadenopathy:    She has no cervical adenopathy.  Neurological: She displays normal reflexes. No cranial nerve deficit. She exhibits normal muscle tone. Coordination normal.  Skin: No rash noted. No erythema.  Psychiatric: Her behavior is normal. Judgment and thought content normal.  neck - tender w/ROM Tearful   Lab Results  Component Value Date   WBC 7.9 08/30/2014   HGB 13.5 08/30/2014   HCT 40.8 08/30/2014   PLT 236.0 08/30/2014   GLUCOSE 89 08/30/2014   CHOL 219* 08/30/2014   TRIG 125.0 08/30/2014   HDL 35.40* 08/30/2014   LDLDIRECT 184.8 09/18/2012   LDLCALC 159* 08/30/2014   ALT 18 08/30/2014   AST 16 08/30/2014  NA 135 08/30/2014   K 4.5 08/30/2014   CL 102 08/30/2014   CREATININE 0.6 08/30/2014   BUN 11 08/30/2014   CO2 25 08/30/2014   TSH 0.71 08/30/2014    Mm Screening Breast Tomo Bilateral  12/19/2014   CLINICAL DATA:  Screening.  EXAM: DIGITAL SCREENING BILATERAL MAMMOGRAM WITH 3D TOMO WITH CAD  COMPARISON:  Previous exam(s).  ACR Breast Density Category c: The breast tissue is  heterogeneously dense, which may obscure small masses.  FINDINGS: There are no findings suspicious for malignancy. Images were processed with CAD.  IMPRESSION: No mammographic evidence of malignancy. A result letter of this screening mammogram will be mailed directly to the patient.  RECOMMENDATION: Screening mammogram in one year. (Code:SM-B-01Y)  BI-RADS CATEGORY  1: Negative.   Electronically Signed   By: Lovey Newcomer M.D.   On: 12/19/2014 13:19    Assessment & Plan:   There are no diagnoses linked to this encounter. I am having Ms. Deyo maintain her Fish Oil, Calcium Carbonate (CALCIUM 500 PO), methimazole, and eletriptan.  No orders of the defined types were placed in this encounter.     Follow-up: No Follow-up on file.  Walker Kehr, MD

## 2015-06-24 NOTE — Assessment & Plan Note (Signed)
Prozac Labs

## 2015-06-24 NOTE — Assessment & Plan Note (Signed)
Labs

## 2015-06-24 NOTE — Progress Notes (Signed)
Pre visit review using our clinic review tool, if applicable. No additional management support is needed unless otherwise documented below in the visit note. 

## 2015-06-24 NOTE — Patient Instructions (Addendum)
Rice bag for neck Body pillow Contour pillow for neck Yoga, pilates

## 2015-06-25 LAB — HEPATIC FUNCTION PANEL
ALBUMIN: 4.2 g/dL (ref 3.5–5.2)
ALK PHOS: 53 U/L (ref 39–117)
ALT: 19 U/L (ref 0–35)
AST: 13 U/L (ref 0–37)
Bilirubin, Direct: 0 mg/dL (ref 0.0–0.3)
TOTAL PROTEIN: 7.2 g/dL (ref 6.0–8.3)
Total Bilirubin: 0.3 mg/dL (ref 0.2–1.2)

## 2015-06-25 LAB — BASIC METABOLIC PANEL
BUN: 12 mg/dL (ref 6–23)
CALCIUM: 9.6 mg/dL (ref 8.4–10.5)
CO2: 30 meq/L (ref 19–32)
Chloride: 102 mEq/L (ref 96–112)
Creatinine, Ser: 0.63 mg/dL (ref 0.40–1.20)
GFR: 109.32 mL/min (ref >60.00)
Glucose, Bld: 87 mg/dL (ref 70–99)
POTASSIUM: 4.4 meq/L (ref 3.5–5.1)
SODIUM: 138 meq/L (ref 135–145)

## 2015-06-25 LAB — VITAMIN B12: Vitamin B-12: 405 pg/mL (ref 211–911)

## 2015-06-25 LAB — VITAMIN D 25 HYDROXY (VIT D DEFICIENCY, FRACTURES): VITD: 22.11 ng/mL — ABNORMAL LOW (ref 30.00–100.00)

## 2015-06-25 LAB — TSH: TSH: 1.06 u[IU]/mL (ref 0.35–4.50)

## 2015-06-25 LAB — T4, FREE: Free T4: 0.78 ng/dL (ref 0.60–1.60)

## 2015-06-25 MED ORDER — ERGOCALCIFEROL 1.25 MG (50000 UT) PO CAPS: 50000.0000 [IU] | ORAL_CAPSULE | ORAL | Status: DC

## 2015-07-21 ENCOUNTER — Other Ambulatory Visit: Payer: Self-pay | Admitting: Internal Medicine

## 2015-08-22 ENCOUNTER — Other Ambulatory Visit: Payer: Self-pay | Admitting: Internal Medicine

## 2015-08-23 ENCOUNTER — Other Ambulatory Visit: Payer: Self-pay | Admitting: Internal Medicine

## 2016-01-14 DIAGNOSIS — M25572 Pain in left ankle and joints of left foot: Secondary | ICD-10-CM | POA: Diagnosis not present

## 2016-04-13 DIAGNOSIS — M791 Myalgia: Secondary | ICD-10-CM | POA: Diagnosis not present

## 2016-04-13 DIAGNOSIS — S3993XA Unspecified injury of pelvis, initial encounter: Secondary | ICD-10-CM | POA: Diagnosis not present

## 2016-04-13 DIAGNOSIS — S3992XA Unspecified injury of lower back, initial encounter: Secondary | ICD-10-CM | POA: Diagnosis not present

## 2016-04-13 DIAGNOSIS — M533 Sacrococcygeal disorders, not elsewhere classified: Secondary | ICD-10-CM | POA: Diagnosis not present

## 2016-04-13 DIAGNOSIS — S300XXA Contusion of lower back and pelvis, initial encounter: Secondary | ICD-10-CM | POA: Diagnosis not present

## 2016-04-13 DIAGNOSIS — Z79899 Other long term (current) drug therapy: Secondary | ICD-10-CM | POA: Diagnosis not present

## 2016-04-13 DIAGNOSIS — M25559 Pain in unspecified hip: Secondary | ICD-10-CM | POA: Diagnosis not present

## 2016-04-13 DIAGNOSIS — R262 Difficulty in walking, not elsewhere classified: Secondary | ICD-10-CM | POA: Diagnosis not present

## 2016-09-01 DIAGNOSIS — Z01411 Encounter for gynecological examination (general) (routine) with abnormal findings: Secondary | ICD-10-CM | POA: Diagnosis not present

## 2016-09-01 DIAGNOSIS — Z1231 Encounter for screening mammogram for malignant neoplasm of breast: Secondary | ICD-10-CM | POA: Diagnosis not present

## 2016-09-01 DIAGNOSIS — D259 Leiomyoma of uterus, unspecified: Secondary | ICD-10-CM | POA: Diagnosis not present

## 2016-10-04 ENCOUNTER — Encounter: Payer: Self-pay | Admitting: Internal Medicine

## 2016-10-04 ENCOUNTER — Other Ambulatory Visit (INDEPENDENT_AMBULATORY_CARE_PROVIDER_SITE_OTHER): Payer: BLUE CROSS/BLUE SHIELD

## 2016-10-04 ENCOUNTER — Ambulatory Visit (INDEPENDENT_AMBULATORY_CARE_PROVIDER_SITE_OTHER): Payer: BLUE CROSS/BLUE SHIELD | Admitting: Internal Medicine

## 2016-10-04 DIAGNOSIS — Z0000 Encounter for general adult medical examination without abnormal findings: Secondary | ICD-10-CM | POA: Diagnosis not present

## 2016-10-04 DIAGNOSIS — Z23 Encounter for immunization: Secondary | ICD-10-CM

## 2016-10-04 DIAGNOSIS — G8929 Other chronic pain: Secondary | ICD-10-CM

## 2016-10-04 DIAGNOSIS — M545 Low back pain: Secondary | ICD-10-CM | POA: Diagnosis not present

## 2016-10-04 LAB — CBC WITH DIFFERENTIAL/PLATELET
BASOS PCT: 0.3 % (ref 0.0–3.0)
Basophils Absolute: 0 10*3/uL (ref 0.0–0.1)
EOS PCT: 1.9 % (ref 0.0–5.0)
Eosinophils Absolute: 0.1 10*3/uL (ref 0.0–0.7)
HCT: 36.7 % (ref 36.0–46.0)
Hemoglobin: 12.3 g/dL (ref 12.0–15.0)
LYMPHS ABS: 1.3 10*3/uL (ref 0.7–4.0)
Lymphocytes Relative: 24 % (ref 12.0–46.0)
MCHC: 33.5 g/dL (ref 30.0–36.0)
MCV: 82 fl (ref 78.0–100.0)
MONOS PCT: 10.7 % (ref 3.0–12.0)
Monocytes Absolute: 0.6 10*3/uL (ref 0.1–1.0)
NEUTROS ABS: 3.4 10*3/uL (ref 1.4–7.7)
NEUTROS PCT: 63.1 % (ref 43.0–77.0)
PLATELETS: 220 10*3/uL (ref 150.0–400.0)
RBC: 4.47 Mil/uL (ref 3.87–5.11)
RDW: 15.3 % (ref 11.5–15.5)
WBC: 5.4 10*3/uL (ref 4.0–10.5)

## 2016-10-04 LAB — URINALYSIS
Hgb urine dipstick: NEGATIVE
KETONES UR: 15 — AB
LEUKOCYTES UA: NEGATIVE
NITRITE: NEGATIVE
Specific Gravity, Urine: >=1.03 — AB (ref 1.000–1.030)
Total Protein, Urine: NEGATIVE
UROBILINOGEN UA: 0.2 (ref 0.0–1.0)
Urine Glucose: NEGATIVE
pH: 6 (ref 5.0–8.0)

## 2016-10-04 LAB — TSH: TSH: 0.55 u[IU]/mL (ref 0.35–4.50)

## 2016-10-04 LAB — LIPID PANEL
CHOLESTEROL: 186 mg/dL (ref 0–200)
HDL: 37.9 mg/dL — AB (ref >39.00)
LDL Cholesterol: 132 mg/dL — ABNORMAL HIGH (ref 0–99)
NonHDL: 148.1
TRIGLYCERIDES: 79 mg/dL (ref 0.0–149.0)
Total CHOL/HDL Ratio: 5
VLDL: 15.8 mg/dL (ref 0.0–40.0)

## 2016-10-04 LAB — BASIC METABOLIC PANEL
BUN: 10 mg/dL (ref 6–23)
CALCIUM: 9.2 mg/dL (ref 8.4–10.5)
CO2: 27 mEq/L (ref 19–32)
Chloride: 103 mEq/L (ref 96–112)
Creatinine, Ser: 0.62 mg/dL (ref 0.40–1.20)
GFR: 110.7 mL/min (ref >60.00)
GLUCOSE: 100 mg/dL — AB (ref 70–99)
POTASSIUM: 3.9 meq/L (ref 3.5–5.1)
SODIUM: 137 meq/L (ref 135–145)

## 2016-10-04 LAB — HEPATIC FUNCTION PANEL
ALBUMIN: 4.2 g/dL (ref 3.5–5.2)
ALK PHOS: 59 U/L (ref 39–117)
ALT: 16 U/L (ref 0–35)
AST: 14 U/L (ref 0–37)
BILIRUBIN TOTAL: 0.4 mg/dL (ref 0.2–1.2)
Bilirubin, Direct: 0.1 mg/dL (ref 0.0–0.3)
Total Protein: 7.3 g/dL (ref 6.0–8.3)

## 2016-10-04 NOTE — Patient Instructions (Signed)
Try TRE Start Vit D

## 2016-10-04 NOTE — Progress Notes (Signed)
Subjective:  Patient ID: Katie Mclaughlin, female    DOB: Dec 02, 1970  Age: 46 y.o. MRN: [id number]  CC: Annual Exam   HPI Katie Mclaughlin presents for well exam C/o coccyx pain - injury at the gym and chronic LBP  Outpatient Medications Prior to Visit  Medication Sig Dispense Refill  . Cholecalciferol (VITAMIN D3) 2000 UNITS capsule Take 1 capsule (2,000 Units total) by mouth daily. (Patient not taking: Reported on 10/04/2016) 100 capsule 3  . eletriptan (RELPAX) 40 MG tablet Take 1 tablet (40 mg total) by mouth as needed for migraine. may repeat in 2 hours if necessary (Patient not taking: Reported on 10/04/2016) 10 tablet 6  . FLUoxetine (PROZAC) 10 MG tablet Take 1 tablet (10 mg total) by mouth daily. (Patient not taking: Reported on 10/04/2016) 30 tablet 6  . methimazole (TAPAZOLE) 5 MG tablet Take 1 tablet (5 mg total) by mouth daily. (Patient not taking: Reported on 10/04/2016) 90 tablet 3  . Vitamin D, Ergocalciferol, (DRISDOL) 50000 UNITS CAPS capsule TAKE 1 CAPSULE (50,000 UNITS TOTAL) BY MOUTH ONCE A WEEK. (Patient not taking: Reported on 10/04/2016) 6 capsule 0   No facility-administered medications prior to visit.     ROS Review of Systems  Constitutional: Negative for activity change, appetite change, chills, fatigue and unexpected weight change.  HENT: Negative for congestion, mouth sores and sinus pressure.   Eyes: Negative for visual disturbance.  Respiratory: Negative for cough and chest tightness.   Gastrointestinal: Negative for abdominal pain and nausea.  Genitourinary: Negative for difficulty urinating, frequency and vaginal pain.  Musculoskeletal: Positive for back pain. Negative for gait problem.  Skin: Negative for pallor and rash.  Neurological: Negative for dizziness, tremors, weakness, numbness and headaches.  Psychiatric/Behavioral: Negative for confusion and sleep disturbance.    Objective:  BP 130/86   Pulse 85   Ht 5\' 3"  (1.6 m)   Wt 176 lb (79.8 kg)    SpO2 98%   BMI 31.18 kg/m   BP Readings from Last 3 Encounters:  10/04/16 130/86  06/24/15 100/70  08/30/14 108/70    Wt Readings from Last 3 Encounters:  10/04/16 176 lb (79.8 kg)  06/24/15 176 lb (79.8 kg)  08/30/14 177 lb (80.3 kg)    Physical Exam  Constitutional: She appears well-developed. No distress.  HENT:  Head: Normocephalic.  Right Ear: External ear normal.  Left Ear: External ear normal.  Nose: Nose normal.  Mouth/Throat: Oropharynx is clear and moist.  Eyes: Conjunctivae are normal. Pupils are equal, round, and reactive to light. Right eye exhibits no discharge. Left eye exhibits no discharge.  Neck: Normal range of motion. Neck supple. No JVD present. No tracheal deviation present. No thyromegaly present.  Cardiovascular: Normal rate, regular rhythm and normal heart sounds.   Pulmonary/Chest: No stridor. No respiratory distress. She has no wheezes.  Abdominal: Soft. Bowel sounds are normal. She exhibits no distension and no mass. There is no tenderness. There is no rebound and no guarding.  Musculoskeletal: She exhibits tenderness. She exhibits no edema.  Lymphadenopathy:    She has no cervical adenopathy.  Neurological: She displays normal reflexes. No cranial nerve deficit. She exhibits normal muscle tone. Coordination normal.  Skin: No rash noted. No erythema.  Psychiatric: She has a normal mood and affect. Her behavior is normal. Judgment and thought content normal.  LS tender w/ROM. Str leg elev (-) B. Good ROM Coccyx tender a little Mild goiter  Lab Results  Component Value Date   WBC 9.1  06/24/2015   HGB 12.9 06/24/2015   HCT 39.3 06/24/2015   PLT 233.0 06/24/2015   GLUCOSE 87 06/24/2015   CHOL 219 (H) 08/30/2014   TRIG 125.0 08/30/2014   HDL 35.40 (L) 08/30/2014   LDLDIRECT 184.8 09/18/2012   LDLCALC 159 (H) 08/30/2014   ALT 19 06/24/2015   AST 13 06/24/2015   NA 138 06/24/2015   K 4.4 06/24/2015   CL 102 06/24/2015   CREATININE 0.63  06/24/2015   BUN 12 06/24/2015   CO2 30 06/24/2015   TSH 1.06 06/24/2015    Mm Screening Breast Tomo Bilateral  Result Date: 12/19/2014 CLINICAL DATA:  Screening. EXAM: DIGITAL SCREENING BILATERAL MAMMOGRAM WITH 3D TOMO WITH CAD COMPARISON:  Previous exam(s). ACR Breast Density Category c: The breast tissue is heterogeneously dense, which may obscure small masses. FINDINGS: There are no findings suspicious for malignancy. Images were processed with CAD. IMPRESSION: No mammographic evidence of malignancy. A result letter of this screening mammogram will be mailed directly to the patient. RECOMMENDATION: Screening mammogram in one year. (Code:SM-B-01Y) BI-RADS CATEGORY  1: Negative. Electronically Signed   By: Lovey Newcomer M.D.   On: 12/19/2014 13:19    Assessment & Plan:   There are no diagnoses linked to this encounter. I am having Ms. Rivard maintain her methimazole, FLUoxetine, Vitamin D3, eletriptan, and Vitamin D (Ergocalciferol).  No orders of the defined types were placed in this encounter.    Follow-up: No Follow-up on file.  Walker Kehr, MD

## 2016-10-04 NOTE — Assessment & Plan Note (Signed)
Coccyx contusion  02/2016

## 2016-10-04 NOTE — Progress Notes (Signed)
Pre visit review using our clinic review tool, if applicable. No additional management support is needed unless otherwise documented below in the visit note. 

## 2016-10-04 NOTE — Assessment & Plan Note (Signed)
We discussed age appropriate health related issues, including available/recomended screening tests and vaccinations. We discussed a need for adhering to healthy diet and exercise. Labs/EKG were reviewed/ordered. All questions were answered.   

## 2016-10-04 NOTE — Addendum Note (Signed)
Addended by: Cresenciano Lick on: 10/04/2016 11:39 AM   Modules accepted: Orders

## 2016-11-09 DIAGNOSIS — R030 Elevated blood-pressure reading, without diagnosis of hypertension: Secondary | ICD-10-CM | POA: Diagnosis not present

## 2016-11-09 DIAGNOSIS — M545 Low back pain: Secondary | ICD-10-CM | POA: Diagnosis not present

## 2016-11-09 DIAGNOSIS — Z6831 Body mass index (BMI) 31.0-31.9, adult: Secondary | ICD-10-CM | POA: Diagnosis not present

## 2016-11-23 ENCOUNTER — Other Ambulatory Visit: Payer: Self-pay | Admitting: Neurological Surgery

## 2016-11-23 DIAGNOSIS — M545 Low back pain: Secondary | ICD-10-CM

## 2016-12-03 ENCOUNTER — Ambulatory Visit
Admission: RE | Admit: 2016-12-03 | Discharge: 2016-12-03 | Disposition: A | Discharge status: Home / Self Care | Payer: BLUE CROSS/BLUE SHIELD | Source: Ambulatory Visit | Attending: Neurological Surgery | Admitting: Neurological Surgery

## 2016-12-03 DIAGNOSIS — M5126 Other intervertebral disc displacement, lumbar region: Secondary | ICD-10-CM | POA: Diagnosis not present

## 2016-12-03 DIAGNOSIS — M545 Low back pain: Secondary | ICD-10-CM

## 2016-12-07 DIAGNOSIS — M545 Low back pain: Secondary | ICD-10-CM | POA: Diagnosis not present

## 2017-02-16 ENCOUNTER — Emergency Department (HOSPITAL_COMMUNITY)
Admission: EM | Admit: 2017-02-16 | Discharge: 2017-02-16 | Disposition: A | Discharge status: Home / Self Care | Payer: BLUE CROSS/BLUE SHIELD | Attending: Emergency Medicine | Admitting: Emergency Medicine

## 2017-02-16 ENCOUNTER — Encounter (HOSPITAL_COMMUNITY): Payer: Self-pay | Admitting: Emergency Medicine

## 2017-02-16 ENCOUNTER — Emergency Department (HOSPITAL_COMMUNITY): Payer: BLUE CROSS/BLUE SHIELD

## 2017-02-16 DIAGNOSIS — S82832A Other fracture of upper and lower end of left fibula, initial encounter for closed fracture: Secondary | ICD-10-CM

## 2017-02-16 DIAGNOSIS — T148XXA Other injury of unspecified body region, initial encounter: Secondary | ICD-10-CM | POA: Diagnosis not present

## 2017-02-16 DIAGNOSIS — X509XXA Other and unspecified overexertion or strenuous movements or postures, initial encounter: Secondary | ICD-10-CM | POA: Diagnosis not present

## 2017-02-16 DIAGNOSIS — Y9301 Activity, walking, marching and hiking: Secondary | ICD-10-CM | POA: Insufficient documentation

## 2017-02-16 DIAGNOSIS — Y929 Unspecified place or not applicable: Secondary | ICD-10-CM | POA: Diagnosis not present

## 2017-02-16 DIAGNOSIS — Y999 Unspecified external cause status: Secondary | ICD-10-CM | POA: Diagnosis not present

## 2017-02-16 DIAGNOSIS — M25572 Pain in left ankle and joints of left foot: Secondary | ICD-10-CM | POA: Diagnosis not present

## 2017-02-16 DIAGNOSIS — S89302A Unspecified physeal fracture of lower end of left fibula, initial encounter for closed fracture: Secondary | ICD-10-CM | POA: Diagnosis not present

## 2017-02-16 DIAGNOSIS — M7989 Other specified soft tissue disorders: Secondary | ICD-10-CM | POA: Diagnosis not present

## 2017-02-16 DIAGNOSIS — S99912A Unspecified injury of left ankle, initial encounter: Secondary | ICD-10-CM | POA: Diagnosis present

## 2017-02-16 MED ORDER — IBUPROFEN 800 MG PO TABS: 800.0000 mg | ORAL_TABLET | Freq: Three times a day (TID) | ORAL | 0 refills | Status: DC | PRN

## 2017-02-16 NOTE — Discharge Instructions (Signed)
Ibuprofen as needed for pain. Ice affected area (see instructions below).  Please call the orthopedic physician listed (or the orthopedic clinic of your choice) today or first thing in the morning to schedule a follow up appointment.   Fractures generally take 4-6 weeks to heal. It is very important to keep your splint dry until your follow up with the orthopedic doctor and a cast can be applied. You may place a plastic bag around the extremity with the splint while bathing to keep it dry. Also try to sleep with the extremity elevated for the next several nights to decrease swelling. Check the toes several times per day to make sure they are not cold, pale, or blue. If this is the case, the splint may be too tight and should return to the ER, your regular doctor or the orthopedist for recheck. Return to the ER for new or worsening symptoms, any additional concerns.   COLD THERAPY DIRECTIONS:  Ice or gel packs can be used to reduce both pain and swelling. Ice is the most helpful within the first 24 to 48 hours after an injury or flareup from overusing a muscle or joint.  Ice is effective, has very few side effects, and is safe for most people to use.   If you expose your skin to cold temperatures for too long or without the proper protection, you can damage your skin or nerves. Watch for signs of skin damage due to cold.   HOME CARE INSTRUCTIONS  Follow these tips to use ice and cold packs safely.  Place a dry or damp towel between the ice and skin. A damp towel will cool the skin more quickly, so you may need to shorten the time that the ice is used.  For a more rapid response, add gentle compression to the ice.  Ice for no more than 10 to 20 minutes at a time. The bonier the area you are icing, the less time it will take to get the benefits of ice.  Check your skin after 5 minutes to make sure there are no signs of a poor response to cold or skin damage.  Rest 20 minutes or more in between uses.    Once your skin is numb, you can end your treatment. You can test numbness by very lightly touching your skin. The touch should be so light that you do not see the skin dimple from the pressure of your fingertip. When using ice, most people will feel these normal sensations in this order: cold, burning, aching, and numbness.

## 2017-02-16 NOTE — ED Triage Notes (Addendum)
Per EMS, patient c/o left ankle pain after "rolling" her ankle while getting out of her car. Swelling noted. Movement to all toes.   BP 122/82 HR 76 RR 18  20g L hand

## 2017-02-16 NOTE — ED Provider Notes (Signed)
[facility] DEPT Provider Note   CSN: [id number] Arrival date & time: 02/16/17  1308 By signing my name below, I, Georgette Shell, attest that this documentation has been prepared under the direction and in the presence of [facility], PA-C. Electronically Signed: Georgette Shell, ED Scribe. 02/16/17. 2:07 PM.  History   Chief Complaint Chief Complaint  Patient presents with  . Ankle Pain    HPI The history is provided by the patient. No language interpreter was used.   HPI Comments: Katie Mclaughlin is a 46 y.o. female with no pertinent PMHx, who presents to the Emergency Department by EMS complaining of left ankle pain with swelling following mechanical injury occurring just PTA. Pt reports she was walking when she "rolled" her ankle inward. Pt states pain is exacerbated with movement and bearing weight to the area. Pt denies fever, chills, focal numbness, or any other associated symptoms.  Past Medical History:  Diagnosis Date  . BRONCHITIS, ACUTE 07/14/2010    Patient Active Problem Mclaughlin   Diagnosis Date Noted  . Low back pain 09/18/2012  . Elevated blood pressure 06/24/2011  . Well adult exam 03/25/2011  . Hyperthyroidism 03/25/2011  . Breast discharge 03/25/2011  . Migraine headache 03/25/2011  . BRONCHITIS, ACUTE 07/14/2010    History reviewed. No pertinent surgical history.  OB History    No data available       Home Medications    Prior to Admission medications   Medication Sig Start Date End Date Taking? Authorizing Provider  Cholecalciferol (VITAMIN D3) 2000 UNITS capsule Take 1 capsule (2,000 Units total) by mouth daily. Patient not taking: Reported on 10/04/2016 06/24/15   Plotnikov, Evie Lacks, MD  ibuprofen (ADVIL,MOTRIN) 800 MG tablet Take 1 tablet (800 mg total) by mouth every 8 (eight) hours as needed for moderate pain. 02/16/17   Ward, Ozella Almond, PA-C  methimazole (TAPAZOLE) 5 MG tablet Take 1 tablet (5 mg total) by mouth daily. Patient not taking: Reported  on 10/04/2016 09/18/12   Plotnikov, Evie Lacks, MD    Family History Family History  Problem Relation Age of Onset  . Diabetes Mother   . Migraines Mother     Social History Social History  Substance Use Topics  . Smoking status: Never Smoker  . Smokeless tobacco: Never Used  . Alcohol use No     Allergies   Patient has no known allergies.   Review of Systems Review of Systems  Constitutional: Negative for chills and fever.  Musculoskeletal: Positive for arthralgias and joint swelling.  Neurological: Negative for numbness.     Physical Exam Updated Vital Signs BP 117/84 (BP Location: Right Arm)   Pulse 71   Temp 98.1 F (36.7 C) (Oral)   Resp 18   LMP 02/02/2017   SpO2 97%   Physical Exam  Constitutional: She appears well-developed and well-nourished. No distress.  HENT:  Head: Normocephalic and atraumatic.  Neck: Neck supple.  Cardiovascular: Normal rate, regular rhythm and normal heart sounds.   No murmur heard. Pulmonary/Chest: Effort normal and breath sounds normal. No respiratory distress. She has no wheezes. She has no rales.  Musculoskeletal: She exhibits edema and tenderness.  Left ankle with  tenderness to palpation of around lateral malleolus.+  swelling. Decreased ROM 2/2 pain. No erythema, ecchymosis, or deformity appreciated. No break in skin. No pain to fifth metatarsal area or navicular region. Achilles intact. Good pedal pulse and cap refill of toes.Sensation grossly intact.  Neurological: She is alert.  Skin: Skin is warm and  dry.  Nursing note and vitals reviewed.  ED Treatments / Results  DIAGNOSTIC STUDIES: Oxygen Saturation is 97% on RA, adequate by my interpretation.   COORDINATION OF CARE: 2:07 PM-Discussed next steps with pt. Pt verbalized understanding and is agreeable with the plan.   Labs (all labs ordered are listed, but only abnormal results are displayed) Labs Reviewed - No data to display  EKG  EKG  Interpretation None       Radiology Dg Ankle Complete Left  Result Date: 02/16/2017 CLINICAL DATA:  Twisting injury to left ankle. Pain swelling on the lateral aspect. EXAM: LEFT ANKLE COMPLETE - 3+ VIEW COMPARISON:  None. FINDINGS: There is a minimally distracted fracture involving the distal aspect of the fibula. There is adjacent soft tissue swelling. Ankle is located. No definite fracture involving the distal tibia. IMPRESSION: Minimally displaced fracture involving the distal fibula. Electronically Signed   By: Markus Daft M.D.   On: 02/16/2017 13:46    Procedures Procedures (including critical care time)  SPLINT APPLICATION Date/Time: 2:59 PM Authorized by: Ozella Almond Ward Consent: Verbal consent obtained. Risks and benefits: risks, benefits and alternatives were discussed Consent given by: patient Splint applied by: orthopedic technician Location details: Left lower extremity Splint type: Posterior splint with stirrups Post-procedure: The splinted body part was neurovascularly unchanged following the procedure. Patient tolerance: Patient tolerated the procedure well with no immediate complications.     Medications Ordered in ED Medications - No data to display   Initial Impression / Assessment and Plan / ED Course  I have reviewed the triage vital signs and the nursing notes.  Pertinent labs & imaging results that were available during my care of the patient were reviewed by me and considered in my medical decision making (see chart for details).    Katie Mclaughlin is a 46 y.o. female who presents to ED for left ankle pain after mechanical fall. LE NVI. No open wounds. X-ray shows minimally displaced fracture involving the distal fibula. Placed in splint and will follow-up with orthopedic. Crutches provided. Home care instructions discussed. Patient understands home care, follow-up plan, and return precautions. All questions answered.     Final Clinical  Impressions(s) / ED Diagnoses   Final diagnoses:  Closed fracture of distal end of left fibula, unspecified fracture morphology, initial encounter    New Prescriptions Discharge Medication Mclaughlin as of 02/16/2017  3:00 PM    START taking these medications   Details  ibuprofen (ADVIL,MOTRIN) 800 MG tablet Take 1 tablet (800 mg total) by mouth every 8 (eight) hours as needed for moderate pain., Starting Wed 02/16/2017, Print      I personally performed the services described in this documentation, which was scribed in my presence. The recorded information has been reviewed and is accurate.    Ward, Ozella Almond, PA-C 02/16/17 1945    Alfonzo Beers, MD 02/17/17 [phone]

## 2017-02-18 DIAGNOSIS — M25572 Pain in left ankle and joints of left foot: Secondary | ICD-10-CM | POA: Diagnosis not present

## 2017-03-11 DIAGNOSIS — M25572 Pain in left ankle and joints of left foot: Secondary | ICD-10-CM | POA: Diagnosis not present

## 2017-03-30 DIAGNOSIS — S82832A Other fracture of upper and lower end of left fibula, initial encounter for closed fracture: Secondary | ICD-10-CM | POA: Diagnosis not present

## 2017-05-04 DIAGNOSIS — S82832D Other fracture of upper and lower end of left fibula, subsequent encounter for closed fracture with routine healing: Secondary | ICD-10-CM | POA: Diagnosis not present

## 2017-05-05 DIAGNOSIS — M79672 Pain in left foot: Secondary | ICD-10-CM | POA: Diagnosis not present

## 2017-05-05 DIAGNOSIS — M84464D Pathological fracture, left fibula, subsequent encounter for fracture with routine healing: Secondary | ICD-10-CM | POA: Diagnosis not present

## 2017-05-10 DIAGNOSIS — M84464D Pathological fracture, left fibula, subsequent encounter for fracture with routine healing: Secondary | ICD-10-CM | POA: Diagnosis not present

## 2017-05-10 DIAGNOSIS — M79672 Pain in left foot: Secondary | ICD-10-CM | POA: Diagnosis not present

## 2017-05-13 DIAGNOSIS — M79672 Pain in left foot: Secondary | ICD-10-CM | POA: Diagnosis not present

## 2017-05-13 DIAGNOSIS — M84464D Pathological fracture, left fibula, subsequent encounter for fracture with routine healing: Secondary | ICD-10-CM | POA: Diagnosis not present

## 2017-05-16 DIAGNOSIS — M79672 Pain in left foot: Secondary | ICD-10-CM | POA: Diagnosis not present

## 2017-05-16 DIAGNOSIS — M84464D Pathological fracture, left fibula, subsequent encounter for fracture with routine healing: Secondary | ICD-10-CM | POA: Diagnosis not present

## 2017-05-20 DIAGNOSIS — M84464D Pathological fracture, left fibula, subsequent encounter for fracture with routine healing: Secondary | ICD-10-CM | POA: Diagnosis not present

## 2017-05-20 DIAGNOSIS — M79672 Pain in left foot: Secondary | ICD-10-CM | POA: Diagnosis not present

## 2017-05-24 DIAGNOSIS — M84464D Pathological fracture, left fibula, subsequent encounter for fracture with routine healing: Secondary | ICD-10-CM | POA: Diagnosis not present

## 2017-05-24 DIAGNOSIS — M79672 Pain in left foot: Secondary | ICD-10-CM | POA: Diagnosis not present

## 2017-06-03 DIAGNOSIS — M84464D Pathological fracture, left fibula, subsequent encounter for fracture with routine healing: Secondary | ICD-10-CM | POA: Diagnosis not present

## 2017-06-03 DIAGNOSIS — M79672 Pain in left foot: Secondary | ICD-10-CM | POA: Diagnosis not present

## 2017-06-06 DIAGNOSIS — M79672 Pain in left foot: Secondary | ICD-10-CM | POA: Diagnosis not present

## 2017-06-06 DIAGNOSIS — M84464D Pathological fracture, left fibula, subsequent encounter for fracture with routine healing: Secondary | ICD-10-CM | POA: Diagnosis not present

## 2017-06-10 DIAGNOSIS — M79672 Pain in left foot: Secondary | ICD-10-CM | POA: Diagnosis not present

## 2017-06-10 DIAGNOSIS — M84464D Pathological fracture, left fibula, subsequent encounter for fracture with routine healing: Secondary | ICD-10-CM | POA: Diagnosis not present

## 2017-06-15 DIAGNOSIS — M84464D Pathological fracture, left fibula, subsequent encounter for fracture with routine healing: Secondary | ICD-10-CM | POA: Diagnosis not present

## 2017-06-15 DIAGNOSIS — M79672 Pain in left foot: Secondary | ICD-10-CM | POA: Diagnosis not present

## 2017-06-17 DIAGNOSIS — M79672 Pain in left foot: Secondary | ICD-10-CM | POA: Diagnosis not present

## 2017-06-17 DIAGNOSIS — M84464D Pathological fracture, left fibula, subsequent encounter for fracture with routine healing: Secondary | ICD-10-CM | POA: Diagnosis not present

## 2017-06-20 DIAGNOSIS — M79672 Pain in left foot: Secondary | ICD-10-CM | POA: Diagnosis not present

## 2017-06-20 DIAGNOSIS — M84464D Pathological fracture, left fibula, subsequent encounter for fracture with routine healing: Secondary | ICD-10-CM | POA: Diagnosis not present

## 2017-06-22 DIAGNOSIS — M84464D Pathological fracture, left fibula, subsequent encounter for fracture with routine healing: Secondary | ICD-10-CM | POA: Diagnosis not present

## 2017-06-22 DIAGNOSIS — M79672 Pain in left foot: Secondary | ICD-10-CM | POA: Diagnosis not present

## 2017-09-07 DIAGNOSIS — D259 Leiomyoma of uterus, unspecified: Secondary | ICD-10-CM | POA: Diagnosis not present

## 2017-09-07 DIAGNOSIS — Z01411 Encounter for gynecological examination (general) (routine) with abnormal findings: Secondary | ICD-10-CM | POA: Diagnosis not present

## 2017-09-07 DIAGNOSIS — Z1231 Encounter for screening mammogram for malignant neoplasm of breast: Secondary | ICD-10-CM | POA: Diagnosis not present

## 2017-09-07 DIAGNOSIS — Z124 Encounter for screening for malignant neoplasm of cervix: Secondary | ICD-10-CM | POA: Diagnosis not present

## 2018-08-13 ENCOUNTER — Encounter (HOSPITAL_COMMUNITY): Payer: Self-pay

## 2018-08-13 ENCOUNTER — Other Ambulatory Visit: Payer: Self-pay

## 2018-08-13 ENCOUNTER — Observation Stay (HOSPITAL_COMMUNITY): Payer: Self-pay

## 2018-08-13 ENCOUNTER — Observation Stay (HOSPITAL_COMMUNITY)
Admission: AD | Admit: 2018-08-13 | Discharge: 2018-08-15 | Disposition: A | Discharge status: Home / Self Care | Payer: BLUE CROSS/BLUE SHIELD | Source: Ambulatory Visit | Attending: Obstetrics and Gynecology | Admitting: Obstetrics and Gynecology

## 2018-08-13 DIAGNOSIS — D649 Anemia, unspecified: Secondary | ICD-10-CM

## 2018-08-13 DIAGNOSIS — E0590 Thyrotoxicosis, unspecified without thyrotoxic crisis or storm: Secondary | ICD-10-CM | POA: Diagnosis present

## 2018-08-13 DIAGNOSIS — D62 Acute posthemorrhagic anemia: Secondary | ICD-10-CM | POA: Insufficient documentation

## 2018-08-13 DIAGNOSIS — N840 Polyp of corpus uteri: Principal | ICD-10-CM | POA: Insufficient documentation

## 2018-08-13 DIAGNOSIS — N939 Abnormal uterine and vaginal bleeding, unspecified: Secondary | ICD-10-CM

## 2018-08-13 DIAGNOSIS — D250 Submucous leiomyoma of uterus: Secondary | ICD-10-CM | POA: Insufficient documentation

## 2018-08-13 DIAGNOSIS — N920 Excessive and frequent menstruation with regular cycle: Secondary | ICD-10-CM | POA: Insufficient documentation

## 2018-08-13 LAB — CBC
HCT: 16.7 % — ABNORMAL LOW (ref 36.0–46.0)
HEMOGLOBIN: 4.9 g/dL — AB (ref 12.0–15.0)
MCH: 21.3 pg — AB (ref 26.0–34.0)
MCHC: 29.3 g/dL — AB (ref 30.0–36.0)
MCV: 72.6 fL — ABNORMAL LOW (ref 80.0–100.0)
Platelets: 222 10*3/uL (ref 150–400)
RBC: 2.3 MIL/uL — ABNORMAL LOW (ref 3.87–5.11)
RDW: 19.8 % — AB (ref 11.5–15.5)
WBC: 9.7 10*3/uL (ref 4.0–10.5)
nRBC: 0.4 % — ABNORMAL HIGH (ref 0.0–0.2)

## 2018-08-13 LAB — URINALYSIS, ROUTINE W REFLEX MICROSCOPIC
BACTERIA UA: NONE SEEN
BILIRUBIN URINE: NEGATIVE
Glucose, UA: NEGATIVE mg/dL
KETONES UR: NEGATIVE mg/dL
LEUKOCYTES UA: NEGATIVE
NITRITE: NEGATIVE
Protein, ur: NEGATIVE mg/dL
SPECIFIC GRAVITY, URINE: 1.004 — AB (ref 1.005–1.030)
pH: 8 (ref 5.0–8.0)

## 2018-08-13 LAB — ABO/RH: ABO/RH(D): AB POS

## 2018-08-13 LAB — PREPARE RBC (CROSSMATCH)

## 2018-08-13 LAB — POCT PREGNANCY, URINE: PREG TEST UR: NEGATIVE

## 2018-08-13 MED ORDER — ACETAMINOPHEN 325 MG PO TABS
650.0000 mg | ORAL_TABLET | Freq: Four times a day (QID) | ORAL | Status: DC | PRN
  Administered 2018-08-13 – 2018-08-14 (×2): 650 mg via ORAL
  Filled 2018-08-13 (×3): qty 2

## 2018-08-13 MED ORDER — POTASSIUM CHLORIDE IN NACL 20-0.9 MEQ/L-% IV SOLN
INTRAVENOUS | Status: DC
  Filled 2018-08-13: qty 1000

## 2018-08-13 MED ORDER — SODIUM CHLORIDE 0.9 % IV SOLN
INTRAVENOUS | Status: DC
  Administered 2018-08-13 – 2018-08-14 (×2): via INTRAVENOUS
  Filled 2018-08-13 (×6): qty 1000

## 2018-08-13 MED ORDER — SODIUM CHLORIDE 0.9 % IV SOLN
510.0000 mg | Freq: Once | INTRAVENOUS | Status: AC
  Administered 2018-08-13: 510 mg via INTRAVENOUS
  Filled 2018-08-13: qty 17

## 2018-08-13 MED ORDER — MEGESTROL ACETATE 40 MG PO TABS
40.0000 mg | ORAL_TABLET | Freq: Two times a day (BID) | ORAL | Status: DC
  Administered 2018-08-13 – 2018-08-15 (×4): 40 mg via ORAL
  Filled 2018-08-13 (×7): qty 1

## 2018-08-13 MED ORDER — SODIUM CHLORIDE 0.9 % IV BOLUS
1000.0000 mL | Freq: Once | INTRAVENOUS | Status: AC
  Administered 2018-08-13: 1000 mL via INTRAVENOUS

## 2018-08-13 NOTE — Plan of Care (Signed)
  Problem: Nutrition: Goal: Adequate nutrition will be maintained Outcome: Progressing   Problem: Pain Managment: Goal: General experience of comfort will improve Outcome: Progressing   Problem: Safety: Goal: Ability to remain free from injury will improve Outcome: Progressing   

## 2018-08-13 NOTE — MAU Provider Note (Signed)
Chief Complaint: Back Pain and Vaginal Bleeding   First Provider Initiated Contact with Patient 08/13/18 1451     SUBJECTIVE HPI: Katie Mclaughlin is a 47 y.o. G60P1001 female who presents to Maternity Admissions reporting vaginal bleeding x 3 weeks that has gotten heavier, now soaking a pad q 15 minutes and passing large clots.   Associated signs and symptoms: Pos for dizziness, weakness, cramping. Neg for fever, chills, syncope, urinary complaints.   Gets Gyn care w/ Dr. Leo Grosser. Last visit 08/2017. Has not seen her for this problems, because she thought it would get better, but she came to MAU this weekend because the bleeding got heavy enough to cause dizziness and interfere w/ work. Pt denies Hx AUB. Always had monthly cycles w/ moderate flow. Dx small fibroids.   Past Medical History:  Diagnosis Date  . BRONCHITIS, ACUTE 07/14/2010   OB History  Gravida Para Term Preterm AB Living  1 1 1     1   SAB TAB Ectopic Multiple Live Births          1    # Outcome Date GA Lbr Len/2nd Weight Sex Delivery Anes PTL Lv  1 Term 2009     CS-LTranv   LIV   History reviewed. No pertinent surgical history. Social History   Socioeconomic History  . Marital status: Married    Spouse name: Not on file  . Number of children: Not on file  . Years of education: Not on file  . Highest education level: Not on file  Occupational History  . Occupation: Engineer, mining  Social Needs  . Financial resource strain: Not on file  . Food insecurity:    Worry: Not on file    Inability: Not on file  . Transportation needs:    Medical: Not on file    Non-medical: Not on file  Tobacco Use  . Smoking status: Never Smoker  . Smokeless tobacco: Never Used  Substance and Sexual Activity  . Alcohol use: No  . Drug use: No  . Sexual activity: Yes  Lifestyle  . Physical activity:    Days per week: Not on file    Minutes per session: Not on file  . Stress: Not on file  Relationships  . Social connections:     Talks on phone: Not on file    Gets together: Not on file    Attends religious service: Not on file    Active member of club or organization: Not on file    Attends meetings of clubs or organizations: Not on file    Relationship status: Not on file  . Intimate partner violence:    Fear of current or ex partner: Not on file    Emotionally abused: Not on file    Physically abused: Not on file    Forced sexual activity: Not on file  Other Topics Concern  . Not on file  Social History Narrative  . Not on file   Family History  Problem Relation Age of Onset  . Diabetes Mother   . Migraines Mother    No current facility-administered medications on file prior to encounter.    Current Outpatient Medications on File Prior to Encounter  Medication Sig Dispense Refill  . Cholecalciferol (VITAMIN D3) 2000 UNITS capsule Take 1 capsule (2,000 Units total) by mouth daily. (Patient not taking: Reported on 10/04/2016) 100 capsule 3  . ibuprofen (ADVIL,MOTRIN) 800 MG tablet Take 1 tablet (800 mg total) by mouth every 8 (eight) hours as  needed for moderate pain. 21 tablet 0  . methimazole (TAPAZOLE) 5 MG tablet Take 1 tablet (5 mg total) by mouth daily. (Patient not taking: Reported on 10/04/2016) 90 tablet 3   No Known Allergies  I have reviewed patient's Past Medical Hx, Surgical Hx, Family Hx, Social Hx, medications and allergies.   Review of Systems  Constitutional: Negative for chills and fever.  Gastrointestinal: Negative for abdominal pain, nausea and vomiting.  Genitourinary: Positive for menstrual problem and vaginal bleeding. Negative for dysuria, flank pain and vaginal discharge.  Musculoskeletal: Positive for back pain.  Neurological: Positive for dizziness and weakness. Negative for syncope.    OBJECTIVE Patient Vitals for the past 24 hrs:  BP Temp Temp src Pulse Resp SpO2 Weight  08/13/18 1400 116/65 98.2 F (36.8 C) Oral 94 18 100 % 81.8 kg   Orthostatic VS for the past  24 hrs:  BP- Lying Pulse- Lying BP- Sitting Pulse- Sitting BP- Standing at 0 minutes Pulse- Standing at 0 minutes  08/13/18 1543 120/66 101 122/70 105 115/67 110     Constitutional: Well-developed, well-nourished female in no acute distress.  Skin; mild pallor Cardiovascular: normal rate Respiratory: normal rate and effort.  GI: Abd soft, non-tender. MS: Extremities nontender, no edema, normal ROM Neurologic: Alert and oriented x 4.  GU: Neg CVAT.  SPECULUM EXAM: NEFG, physiologic discharge, small amount of dark red blood noted, cervix clean, no polyps.  BIMANUAL: cervix closed; uterus not obviously enlarged, no adnexal tenderness or masses, No CMT.  LAB RESULTS Results for orders placed or performed during the hospital encounter of 08/13/18 (from the past 24 hour(s))  Urinalysis, Routine w reflex microscopic     Status: Abnormal   Collection Time: 08/13/18  2:20 PM  Result Value Ref Range   Color, Urine STRAW (A) YELLOW   APPearance CLEAR CLEAR   Specific Gravity, Urine 1.004 (L) 1.005 - 1.030   pH 8.0 5.0 - 8.0   Glucose, UA NEGATIVE NEGATIVE mg/dL   Hgb urine dipstick LARGE (A) NEGATIVE   Bilirubin Urine NEGATIVE NEGATIVE   Ketones, ur NEGATIVE NEGATIVE mg/dL   Protein, ur NEGATIVE NEGATIVE mg/dL   Nitrite NEGATIVE NEGATIVE   Leukocytes, UA NEGATIVE NEGATIVE   RBC / HPF 0-5 0 - 5 RBC/hpf   WBC, UA 0-5 0 - 5 WBC/hpf   Bacteria, UA NONE SEEN NONE SEEN   Squamous Epithelial / LPF 0-5 0 - 5  Pregnancy, urine POC     Status: None   Collection Time: 08/13/18  2:21 PM  Result Value Ref Range   Preg Test, Ur NEGATIVE NEGATIVE  CBC     Status: Abnormal   Collection Time: 08/13/18  3:24 PM  Result Value Ref Range   WBC 9.7 4.0 - 10.5 K/uL   RBC 2.30 (L) 3.87 - 5.11 MIL/uL   Hemoglobin 4.9 (LL) 12.0 - 15.0 g/dL   HCT 16.7 (L) 36.0 - 46.0 %   MCV 72.6 (L) 80.0 - 100.0 fL   MCH 21.3 (L) 26.0 - 34.0 pg   MCHC 29.3 (L) 30.0 - 36.0 g/dL   RDW 19.8 (H) 11.5 - 15.5 %   Platelets  222 150 - 400 K/uL   nRBC 0.4 (H) 0.0 - 0.2 %    IMAGING No results found.  MAU COURSE Orders Placed This Encounter  Procedures  . Urinalysis, Routine w reflex microscopic  . CBC  . Orthostatic vital signs  . Pregnancy, urine POC  . Type and screen  . Insert  peripheral IV   Meds ordered this encounter  Medications  . sodium chloride 0.9 % bolus 1,000 mL   Discussed Hx, labs, exam w/ Dr. Elonda Husky. Agrees w/ POC to call Dr. Phineas Real. Discussed Hx, labs, exam w/ Dr. Phineas Real. New orders: Observe on third floor for blood transfusion. Type and screen 2 Units.   MDM Symptomatic severe anemia 2/2 AUB possibly due to fibroids or perimenopausal changes, but other etiologies have not been ruled out. Not bleeding heavily during exam.   ASSESSMENT 1. Anemia due to acute blood loss   2. Abnormal uterine bleeding (AUB)   3. Symptomatic anemia     PLAN Care of pt turned over to Dr. Phineas Real.  Plan to Observe on third floor  Pt consents to transfusion.   Tamala Julian, [place], [place] 08/13/2018  3:44 PM

## 2018-08-13 NOTE — MAU Note (Signed)
Usually period is 5-7 days, states LMP was 3 weeks ago, has been bleeding intermittently since then. States over the past week the bleeding has gotten heavier and is heavier then her normal period. Wearing 2 pads at a time and is changing those 2-3 times an hour, states pads are saturated and is feeling weak  Started having lower back pain today, 6/10, intermittent

## 2018-08-13 NOTE — H&P (Signed)
@  Katie Mclaughlin 1971/02/22 [id number]        47 y.o.  G1P1001 presents with a history of regular monthly menses until approximately 3 weeks ago when she started bleeding and has bled daily since.  Most recently has passed frequent clots.  Today patient noticed she was dizzy with ambulation and presented to maternity admissions.  Reports having been told she has small fibroids but no other significant GYN history.  Last GYN checkup reported approximately 1 year ago.  She is otherwise doing well eating, drinking, voiding and having bowel movements without difficulty.  Past medical history,surgical history, problem list, medications, allergies, family history and social history were all reviewed and documented in the EPIC chart.  Directed ROS with pertinent positives and negatives documented in the history of present illness/assessment and plan.  Exam: Triage nurse assistant Vitals:   08/13/18 1400  BP: 116/65  Pulse: 94  Resp: 18  Temp: 98.2 F (36.8 C)  TempSrc: Oral  SpO2: 100%  Weight: 81.8 kg   General appearance:  Normal in no acute distress HEENT normal Lungs clear Cardiac regular rate no rubs murmurs or gallops Abdomen soft nontender without masses guarding rebound Pelvic external BUS vagina with small amount of blood in the vagina.  After evacuation no active bleeding.  Cervix grossly normal.  Bimanual shows uterus grossly normal in size midline mobile nontender.  Adnexa without masses or tenderness  Assessment/Plan:  47 y.o. G1P1001 with 3 weeks of active bleeding passing clots at times.  Otherwise was having regular menses proceeding this.  Hemoglobin 4.9 with repeat 5.2.  UPT negative.  Orthostatic blood pressure checks negative.  History of small myomas previously noted.  Reviewed differential with the patient and her husband to include DUB vs structural abnormality such as endometrial polyps or submucous myomas.  Discussed low hemoglobin although without orthostatic  blood pressure at this point little reserve if resumes active bleeding.  Options for transfusion now versus try to avoid with iron infusion, IV hydration, Megace 40 mg twice daily.  Risks of transfusion discussed to include transfusion reaction hepatitis HIV and other potential infectious sequela.  The patient would accept transfusion if needed and signed her consent, but would prefer to try to avoid if possible.  She understands her situation may change acutely and that we will proceed with transfusion but at this point we will hold and go with iron infusion, hydration and high-dose progesterone.  Assuming she remains stable this evening will recheck CBC in the morning and reassess her clinically.  Discussed need for further evaluation to rule out endometrial abnormalities which can be done as an outpatient.  Will go ahead and schedule ultrasound this evening to rule out gross pathology/nonpalpable abnormalities.  Patient and her husband are comfortable with this plan.    Anastasio Auerbach MD, 5:16 PM 08/13/2018

## 2018-08-14 ENCOUNTER — Observation Stay (HOSPITAL_COMMUNITY): Payer: Self-pay | Admitting: Anesthesiology

## 2018-08-14 ENCOUNTER — Encounter (HOSPITAL_COMMUNITY): Payer: Self-pay | Admitting: Anesthesiology

## 2018-08-14 ENCOUNTER — Encounter (HOSPITAL_COMMUNITY)
Admission: AD | Disposition: A | Discharge status: Home / Self Care | Payer: Self-pay | Source: Ambulatory Visit | Attending: Obstetrics and Gynecology

## 2018-08-14 DIAGNOSIS — D62 Acute posthemorrhagic anemia: Secondary | ICD-10-CM | POA: Diagnosis present

## 2018-08-14 DIAGNOSIS — N939 Abnormal uterine and vaginal bleeding, unspecified: Secondary | ICD-10-CM | POA: Diagnosis present

## 2018-08-14 DIAGNOSIS — D250 Submucous leiomyoma of uterus: Secondary | ICD-10-CM | POA: Diagnosis present

## 2018-08-14 HISTORY — PX: HYSTEROSCOPY W/D&C: SHX1775

## 2018-08-14 LAB — CBC
HCT: 17.8 % — ABNORMAL LOW (ref 36.0–46.0)
HCT: 12.7 % — ABNORMAL LOW (ref 36.0–46.0)
HEMATOCRIT: 23.9 % — AB (ref 36.0–46.0)
HEMOGLOBIN: 8 g/dL — AB (ref 12.0–15.0)
Hemoglobin: 5.2 g/dL — CL (ref 12.0–15.0)
Hemoglobin: 3.8 g/dL — CL (ref 12.0–15.0)
MCH: 21.4 pg — ABNORMAL LOW (ref 26.0–34.0)
MCH: 21.8 pg — ABNORMAL LOW (ref 26.0–34.0)
MCH: 27 pg (ref 26.0–34.0)
MCHC: 29.2 g/dL — ABNORMAL LOW (ref 30.0–36.0)
MCHC: 29.9 g/dL — AB (ref 30.0–36.0)
MCHC: 33.5 g/dL (ref 30.0–36.0)
MCV: 73.3 fL — ABNORMAL LOW (ref 80.0–100.0)
MCV: 73 fL — ABNORMAL LOW (ref 80.0–100.0)
MCV: 80.7 fL (ref 80.0–100.0)
NRBC: 0 % (ref 0.0–0.2)
Platelets: 243 10*3/uL (ref 150–400)
Platelets: 201 10*3/uL (ref 150–400)
Platelets: 173 10*3/uL (ref 150–400)
RBC: 2.43 MIL/uL — ABNORMAL LOW (ref 3.87–5.11)
RBC: 1.74 MIL/uL — ABNORMAL LOW (ref 3.87–5.11)
RBC: 2.96 MIL/uL — AB (ref 3.87–5.11)
RDW: 20 % — ABNORMAL HIGH (ref 11.5–15.5)
RDW: 20.6 % — AB (ref 11.5–15.5)
RDW: 18.8 % — ABNORMAL HIGH (ref 11.5–15.5)
WBC: 10.1 10*3/uL (ref 4.0–10.5)
WBC: 8.7 10*3/uL (ref 4.0–10.5)
WBC: 12.6 10*3/uL — ABNORMAL HIGH (ref 4.0–10.5)
nRBC: 0.3 % — ABNORMAL HIGH (ref 0.0–0.2)
nRBC: 0.6 % — ABNORMAL HIGH (ref 0.0–0.2)

## 2018-08-14 LAB — POCT I-STAT EG7
ACID-BASE DEFICIT: 4 mmol/L — AB (ref 0.0–2.0)
Bicarbonate: 20.3 mmol/L (ref 20.0–28.0)
Calcium, Ion: 1.12 mmol/L — ABNORMAL LOW (ref 1.15–1.40)
HEMATOCRIT: 20 % — AB (ref 36.0–46.0)
HEMOGLOBIN: 6.8 g/dL — AB (ref 12.0–15.0)
O2 SAT: 100 %
PCO2 VEN: 34.5 mmHg — AB (ref 44.0–60.0)
POTASSIUM: 3.9 mmol/L (ref 3.5–5.1)
Sodium: 140 mmol/L (ref 135–145)
TCO2: 21 mmol/L — ABNORMAL LOW (ref 22–32)
pH, Ven: 7.378 (ref 7.250–7.430)
pO2, Ven: 240 mmHg — ABNORMAL HIGH (ref 32.0–45.0)

## 2018-08-14 LAB — PREPARE RBC (CROSSMATCH)

## 2018-08-14 LAB — HEMOGLOBIN AND HEMATOCRIT, BLOOD
HCT: 27.3 % — ABNORMAL LOW (ref 36.0–46.0)
Hemoglobin: 9 g/dL — ABNORMAL LOW (ref 12.0–15.0)

## 2018-08-14 LAB — MRSA PCR SCREENING: MRSA by PCR: NEGATIVE

## 2018-08-14 SURGERY — Surgical Case
Anesthesia: *Unknown

## 2018-08-14 SURGERY — DILATATION AND CURETTAGE /HYSTEROSCOPY
Anesthesia: General | Site: "Vagina "

## 2018-08-14 MED ORDER — SIMETHICONE 80 MG PO CHEW: 80.0000 mg | CHEWABLE_TABLET | Freq: Four times a day (QID) | ORAL | Status: DC | PRN

## 2018-08-14 MED ORDER — IBUPROFEN 600 MG PO TABS
600.0000 mg | ORAL_TABLET | Freq: Four times a day (QID) | ORAL | Status: DC
  Administered 2018-08-14 – 2018-08-15 (×3): 600 mg via ORAL
  Filled 2018-08-14 (×3): qty 1

## 2018-08-14 MED ORDER — FENTANYL CITRATE (PF) 250 MCG/5ML IJ SOLN
INTRAMUSCULAR | Status: DC | PRN
  Administered 2018-08-14: 100 ug via INTRAVENOUS
  Administered 2018-08-14 (×3): 50 ug via INTRAVENOUS

## 2018-08-14 MED ORDER — LACTATED RINGERS IV SOLN
INTRAVENOUS | Status: DC | PRN
  Administered 2018-08-14: 11:00:00 via INTRAVENOUS

## 2018-08-14 MED ORDER — SUGAMMADEX SODIUM 200 MG/2ML IV SOLN
INTRAVENOUS | Status: DC | PRN
  Administered 2018-08-14: 200 mg via INTRAVENOUS

## 2018-08-14 MED ORDER — SODIUM CHLORIDE 0.9 % IV SOLN
2.0000 g | Freq: Once | INTRAVENOUS | Status: AC
  Administered 2018-08-14: 2 g via INTRAVENOUS
  Filled 2018-08-14: qty 2

## 2018-08-14 MED ORDER — LIDOCAINE 2% (20 MG/ML) 5 ML SYRINGE
INTRAMUSCULAR | Status: DC | PRN
  Administered 2018-08-14: 60 mg via INTRAVENOUS

## 2018-08-14 MED ORDER — SODIUM CHLORIDE 0.9 % IR SOLN
Status: DC | PRN
  Administered 2018-08-14: 3000 mL

## 2018-08-14 MED ORDER — MIDAZOLAM HCL 5 MG/5ML IJ SOLN
INTRAMUSCULAR | Status: DC | PRN
  Administered 2018-08-14: 2 mg via INTRAVENOUS

## 2018-08-14 MED ORDER — LIDOCAINE HCL 2 % IJ SOLN
INTRAMUSCULAR | Status: DC | PRN
  Administered 2018-08-14: 10 mL

## 2018-08-14 MED ORDER — ROCURONIUM BROMIDE 10 MG/ML (PF) SYRINGE
PREFILLED_SYRINGE | INTRAVENOUS | Status: DC | PRN
  Administered 2018-08-14: 60 mg via INTRAVENOUS

## 2018-08-14 MED ORDER — SILVER NITRATE-POT NITRATE 75-25 % EX MISC
CUTANEOUS | Status: DC | PRN
  Administered 2018-08-14: 2

## 2018-08-14 MED ORDER — PROPOFOL 10 MG/ML IV BOLUS
INTRAVENOUS | Status: DC | PRN
  Administered 2018-08-14: 100 mg via INTRAVENOUS
  Administered 2018-08-14: 50 mg via INTRAVENOUS

## 2018-08-14 MED ORDER — ROCURONIUM BROMIDE 100 MG/10ML IV SOLN
INTRAVENOUS | Status: AC
  Filled 2018-08-14: qty 1

## 2018-08-14 MED ORDER — DEXAMETHASONE SODIUM PHOSPHATE 10 MG/ML IJ SOLN
INTRAMUSCULAR | Status: DC | PRN
  Administered 2018-08-14: 10 mg via INTRAVENOUS

## 2018-08-14 MED ORDER — TRANEXAMIC ACID 650 MG PO TABS
1300.0000 mg | ORAL_TABLET | Freq: Three times a day (TID) | ORAL | Status: DC
  Administered 2018-08-14: 1300 mg via ORAL
  Filled 2018-08-14 (×4): qty 2

## 2018-08-14 MED ORDER — SODIUM CHLORIDE 0.9 % IV SOLN
INTRAVENOUS | Status: DC | PRN
  Administered 2018-08-14: 12:00:00 via INTRAVENOUS

## 2018-08-14 MED ORDER — LIDOCAINE HCL 2 % IJ SOLN
INTRAMUSCULAR | Status: AC
  Filled 2018-08-14: qty 20

## 2018-08-14 MED ORDER — SUGAMMADEX SODIUM 200 MG/2ML IV SOLN
INTRAVENOUS | Status: AC
  Filled 2018-08-14: qty 2

## 2018-08-14 MED ORDER — ONDANSETRON 8 MG PO TBDP
8.0000 mg | ORAL_TABLET | Freq: Three times a day (TID) | ORAL | Status: DC | PRN
  Filled 2018-08-14: qty 1

## 2018-08-14 MED ORDER — KETOROLAC TROMETHAMINE 30 MG/ML IJ SOLN: 30.0000 mg | Freq: Four times a day (QID) | INTRAMUSCULAR | Status: DC

## 2018-08-14 MED ORDER — HYDROCODONE-ACETAMINOPHEN 5-325 MG PO TABS: 1.0000 | ORAL_TABLET | ORAL | Status: DC | PRN

## 2018-08-14 MED ORDER — DEXAMETHASONE SODIUM PHOSPHATE 10 MG/ML IJ SOLN
INTRAMUSCULAR | Status: AC
  Filled 2018-08-14: qty 1

## 2018-08-14 MED ORDER — ONDANSETRON HCL 4 MG/2ML IJ SOLN
INTRAMUSCULAR | Status: AC
  Filled 2018-08-14: qty 2

## 2018-08-14 MED ORDER — LACTATED RINGERS IV SOLN
INTRAVENOUS | Status: DC
  Administered 2018-08-14: 16:00:00 via INTRAVENOUS

## 2018-08-14 MED ORDER — METOCLOPRAMIDE HCL 5 MG/ML IJ SOLN: 10.0000 mg | Freq: Once | INTRAMUSCULAR | Status: DC | PRN

## 2018-08-14 MED ORDER — ONDANSETRON HCL 4 MG/2ML IJ SOLN
INTRAMUSCULAR | Status: DC | PRN
  Administered 2018-08-14: 4 mg via INTRAVENOUS

## 2018-08-14 MED ORDER — PROPOFOL 10 MG/ML IV BOLUS
INTRAVENOUS | Status: AC
  Filled 2018-08-14: qty 20

## 2018-08-14 MED ORDER — FENTANYL CITRATE (PF) 250 MCG/5ML IJ SOLN
INTRAMUSCULAR | Status: AC
  Filled 2018-08-14: qty 5

## 2018-08-14 MED ORDER — MIDAZOLAM HCL 2 MG/2ML IJ SOLN
INTRAMUSCULAR | Status: AC
  Filled 2018-08-14: qty 2

## 2018-08-14 MED ORDER — LACTATED RINGERS IV SOLN
INTRAVENOUS | Status: DC
  Administered 2018-08-14: 14:00:00 via INTRAVENOUS

## 2018-08-14 MED ORDER — LIDOCAINE HCL (CARDIAC) PF 100 MG/5ML IV SOSY
PREFILLED_SYRINGE | INTRAVENOUS | Status: AC
  Filled 2018-08-14: qty 5

## 2018-08-14 MED ORDER — SODIUM CHLORIDE 0.9% IV SOLUTION
Freq: Once | INTRAVENOUS | Status: AC
  Administered 2018-08-14: 06:00:00 via INTRAVENOUS

## 2018-08-14 MED ORDER — SODIUM CHLORIDE 0.9% IV SOLUTION: Freq: Once | INTRAVENOUS | Status: DC

## 2018-08-14 MED ORDER — MEPERIDINE HCL 25 MG/ML IJ SOLN: 6.2500 mg | INTRAMUSCULAR | Status: DC | PRN

## 2018-08-14 MED ORDER — FENTANYL CITRATE (PF) 100 MCG/2ML IJ SOLN: 25.0000 ug | INTRAMUSCULAR | Status: DC | PRN

## 2018-08-14 SURGICAL SUPPLY — 16 items
BIPOLAR CUTTING LOOP 21FR (ELECTRODE)
BOOTIES KNEE HIGH SLOAN (MISCELLANEOUS) ×8 IMPLANT
CATH ROBINSON RED A/P 16FR (CATHETERS) ×7 IMPLANT
DILATOR CANAL MILEX (MISCELLANEOUS) IMPLANT
ELECT COAG BIPOL BALL 21FR (ELECTRODE) IMPLANT
ELECT REM PT RETURN 9FT ADLT (ELECTROSURGICAL)
ELECTRODE REM PT RTRN 9FT ADLT (ELECTROSURGICAL) IMPLANT
GLOVE BIOGEL PI IND STRL 7.0 (GLOVE) ×2 IMPLANT
GLOVE BIOGEL PI INDICATOR 7.0 (GLOVE) ×2
GLOVE SURG SS PI 6.5 STRL IVOR (GLOVE) ×8 IMPLANT
GOWN STRL REUS W/TWL LRG LVL3 (GOWN DISPOSABLE) ×8 IMPLANT
KIT PROCEDURE FLUENT (KITS) ×4 IMPLANT
LOOP CUTTING BIPOLAR 21FR (ELECTRODE) IMPLANT
PACK VAGINAL MINOR WOMEN LF (CUSTOM PROCEDURE TRAY) ×4 IMPLANT
PAD OB MATERNITY 4.3X12.25 (PERSONAL CARE ITEMS) ×4 IMPLANT
TOWEL OR 17X24 6PK STRL BLUE (TOWEL DISPOSABLE) ×8 IMPLANT

## 2018-08-14 NOTE — Transfer of Care (Signed)
Immediate Anesthesia Transfer of Care Note  Patient: Katie Mclaughlin  Procedure(s) Performed: DILATATION AND CURETTAGE /HYSTEROSCOPY (N/A Vagina )  Patient Location: PACU  Anesthesia Type:General  Level of Consciousness: awake, alert  and oriented  Airway & Oxygen Therapy: Patient Spontanous Breathing and Patient connected to nasal cannula oxygen  Post-op Assessment: Report given to RN and Post -op Vital signs reviewed and stable  Post vital signs: Reviewed and stable  Last Vitals:  Vitals Value Taken Time  BP    Temp    Pulse 98 08/14/2018 12:40 PM  Resp    SpO2 100 % 08/14/2018 12:40 PM  Vitals shown include unvalidated device data.  Last Pain:  Vitals:   08/14/18 1000  TempSrc: Oral  PainSc:       Patients Stated Pain Goal: 0 (70/48/88 9169)  Complications: No apparent anesthesia complications

## 2018-08-14 NOTE — Anesthesia Postprocedure Evaluation (Signed)
Anesthesia Post Note  Patient: Katie Mclaughlin  Procedure(s) Performed: DILATATION AND CURETTAGE /HYSTEROSCOPY (N/A Vagina )     Patient location during evaluation: PACU Anesthesia Type: General Level of consciousness: awake and alert and oriented Pain management: pain level controlled Vital Signs Assessment: post-procedure vital signs reviewed and stable Respiratory status: spontaneous breathing, nonlabored ventilation, respiratory function stable and patient connected to nasal cannula oxygen Cardiovascular status: blood pressure returned to baseline and stable Postop Assessment: no apparent nausea or vomiting Anesthetic complications: no    Last Vitals:  Vitals:   08/14/18 1417 08/14/18 1417  BP:  116/65  Pulse:  81  Resp:  18  Temp: 37.3 C 37.3 C  SpO2:  97%    Last Pain:  Vitals:   08/14/18 1417  TempSrc: Oral  PainSc:    Pain Goal: Patients Stated Pain Goal: 0 (08/13/18 1910)               Angeliyah Kirkey A.

## 2018-08-14 NOTE — Anesthesia Preprocedure Evaluation (Signed)
Anesthesia Evaluation  Patient identified by MRN, date of birth, ID band Patient awake    Reviewed: Allergy & Precautions, NPO status , Patient's Chart, lab work & pertinent test results  Airway Mallampati: II  TM Distance: >3 FB Neck ROM: Full    Dental no notable dental hx. (+) Teeth Intact   Pulmonary neg pulmonary ROS,    Pulmonary exam normal breath sounds clear to auscultation       Cardiovascular negative cardio ROS Normal cardiovascular exam Rhythm:Regular Rate:Normal     Neuro/Psych  Headaches, negative psych ROS   GI/Hepatic negative GI ROS, Neg liver ROS,   Endo/Other  Hyperthyroidism Obesity  Renal/GU negative Renal ROS  negative genitourinary   Musculoskeletal negative musculoskeletal ROS (+)   Abdominal (+) + obese,   Peds  Hematology  (+) anemia , Being transfused PRBC's , currently finishing 2nd unit.   Anesthesia Other Findings   Reproductive/Obstetrics Submucous myoma Menorrhagia                             Anesthesia Physical Anesthesia Plan  ASA: II  Anesthesia Plan: General   Post-op Pain Management:    Induction: Intravenous  PONV Risk Score and Plan: 4 or greater and Scopolamine patch - Pre-op, Ondansetron, Dexamethasone and Treatment may vary due to age or medical condition  Airway Management Planned: LMA  Additional Equipment:   Intra-op Plan:   Post-operative Plan: Extubation in OR  Informed Consent: I have reviewed the patients History and Physical, chart, labs and discussed the procedure including the risks, benefits and alternatives for the proposed anesthesia with the patient or authorized representative who has indicated his/her understanding and acceptance.   Dental advisory given  Plan Discussed with: CRNA and Surgeon  Anesthesia Plan Comments:         Anesthesia Quick Evaluation

## 2018-08-14 NOTE — Progress Notes (Signed)
CRITICAL VALUE ALERT  Critical Value: Hg of 3.8  Date & Time Notied:  08/14/18 0559  Provider Notified: Dr. Phineas Real  Orders Received/Actions taken: numeric page left

## 2018-08-14 NOTE — Progress Notes (Signed)
Initial visit with Timberlyn and her visitor.  Provided education about Advance Directives:Living Will and Laura.  Pt and visitor report that Zellie's spouse is on his way from [place] and that she would like to delay completion of these documents until his arrival.    I left the documents with instructions not to complete part C without a witness and notary.  Pt verbalized understanding.    Followed up with pt's RN, Pam, to update on pt status.  Please page as further needs arise.  Donald Prose. Elyn Peers, M.Div. [facility] Chaplain Pager [phone] Office [phone]     08/14/18 1007  Clinical Encounter Type  Visited With Patient and family together  Visit Type Initial  Advance Directives (For Healthcare)  Does Patient Have a Medical Advance Directive? No  Would patient like information on creating a medical advance directive? Yes (Inpatient - patient defers creating a medical advance directive at this time)

## 2018-08-14 NOTE — Progress Notes (Signed)
Patient ID: Katie Mclaughlin, female   DOB: 05-21-1971, 47 y.o.   MRN: [id number] Katie Mclaughlin 07-Jan-1971 [id number]   Subjective: Awake, alert.  Headache with standing.  Objective: Vital signs in last 24 hours: Temp:  [98.1 F (36.7 C)-101.2 F (38.4 C)] 98.9 F (37.2 C) (11/25 0737) Pulse Rate:  [94-109] 94 (11/25 0737) Resp:  [16-19] 17 (11/25 0737) BP: (94-125)/(53-67) 107/59 (11/25 0737) SpO2:  [93 %-100 %] 96 % (11/25 0737) Weight:  [81.6 kg-81.8 kg] 81.6 kg (11/24 1738) Last BM Date: 08/12/18  EXAM General: awake, alert and no distress Resp: clear to auscultation bilaterally Cardio: regular rate and rhythm GI: soft, non2activetenderness, bowel sounds  Lower Extremities: Without swelling or tenderness Vaginal Bleeding: Pad with mild amount of menstrual staining  Lab Results:  Results for orders placed or performed during the hospital encounter of 08/13/18 (from the past 24 hour(s))  Urinalysis, Routine w reflex microscopic     Status: Abnormal   Collection Time: 08/13/18  2:20 PM  Result Value Ref Range   Color, Urine STRAW (A) YELLOW   APPearance CLEAR CLEAR   Specific Gravity, Urine 1.004 (L) 1.005 - 1.030   pH 8.0 5.0 - 8.0   Glucose, UA NEGATIVE NEGATIVE mg/dL   Hgb urine dipstick LARGE (A) NEGATIVE   Bilirubin Urine NEGATIVE NEGATIVE   Ketones, ur NEGATIVE NEGATIVE mg/dL   Protein, ur NEGATIVE NEGATIVE mg/dL   Nitrite NEGATIVE NEGATIVE   Leukocytes, UA NEGATIVE NEGATIVE   RBC / HPF 0-5 0 - 5 RBC/hpf   WBC, UA 0-5 0 - 5 WBC/hpf   Bacteria, UA NONE SEEN NONE SEEN   Squamous Epithelial / LPF 0-5 0 - 5  Pregnancy, urine POC     Status: None   Collection Time: 08/13/18  2:21 PM  Result Value Ref Range   Preg Test, Ur NEGATIVE NEGATIVE  CBC     Status: Abnormal   Collection Time: 08/13/18  3:24 PM  Result Value Ref Range   WBC 9.7 4.0 - 10.5 K/uL   RBC 2.30 (L) 3.87 - 5.11 MIL/uL   Hemoglobin 4.9 (LL) 12.0 - 15.0 g/dL   HCT 16.7 (L) 36.0 - 46.0 %   MCV 72.6  (L) 80.0 - 100.0 fL   MCH 21.3 (L) 26.0 - 34.0 pg   MCHC 29.3 (L) 30.0 - 36.0 g/dL   RDW 19.8 (H) 11.5 - 15.5 %   Platelets 222 150 - 400 K/uL   nRBC 0.4 (H) 0.0 - 0.2 %  Type and screen     Status: None (Preliminary result)   Collection Time: 08/13/18  3:47 PM  Result Value Ref Range   ABO/RH(D) AB POS    Antibody Screen NEG    Sample Expiration 08/16/2018    Unit Number [id number]    Blood Component Type RED CELLS,LR    Unit division 00    Status of Unit REL FROM [facility]    Transfusion Status OK TO TRANSFUSE    Crossmatch Result Compatible    Unit Number [id number]    Blood Component Type RED CELLS,LR    Unit division 00    Status of Unit REL FROM [facility]    Transfusion Status OK TO TRANSFUSE    Crossmatch Result Compatible    Unit Number [id number]    Blood Component Type RED CELLS,LR    Unit division 00    Status of Unit ALLOCATED    Transfusion Status OK TO TRANSFUSE    Crossmatch Result Compatible  Unit Number [id number]    Blood Component Type RED CELLS,LR    Unit division 00    Status of Unit ISSUED    Transfusion Status OK TO TRANSFUSE    Crossmatch Result      Compatible Performed at [facility], [address], [place], [place] [address]   ABO/Rh     Status: None   Collection Time: 08/13/18  4:10 PM  Result Value Ref Range   ABO/RH(D)      AB POS Performed at [facility], [address], [place], [place] [address]   CBC     Status: Abnormal   Collection Time: 08/13/18  4:11 PM  Result Value Ref Range   WBC 10.1 4.0 - 10.5 K/uL   RBC 2.43 (L) 3.87 - 5.11 MIL/uL   Hemoglobin 5.2 (LL) 12.0 - 15.0 g/dL   HCT 17.8 (L) 36.0 - 46.0 %   MCV 73.3 (L) 80.0 - 100.0 fL   MCH 21.4 (L) 26.0 - 34.0 pg   MCHC 29.2 (L) 30.0 - 36.0 g/dL   RDW 20.0 (H) 11.5 - 15.5 %   Platelets 243 150 - 400 K/uL   nRBC 0.3 (H) 0.0 - 0.2 %  Prepare RBC     Status: None   Collection Time: 08/13/18  4:11 PM  Result Value Ref Range   Order Confirmation       ORDER PROCESSED BY BLOOD BANK Performed at [facility] [address] [place], [place] [address]   CBC     Status: Abnormal   Collection Time: 08/14/18  5:34 AM  Result Value Ref Range   WBC 8.7 4.0 - 10.5 K/uL   RBC 1.74 (L) 3.87 - 5.11 MIL/uL   Hemoglobin 3.8 (LL) 12.0 - 15.0 g/dL   HCT 12.7 (L) 36.0 - 46.0 %   MCV 73.0 (L) 80.0 - 100.0 fL   MCH 21.8 (L) 26.0 - 34.0 pg   MCHC 29.9 (L) 30.0 - 36.0 g/dL   RDW 20.6 (H) 11.5 - 15.5 %   Platelets 201 150 - 400 K/uL   nRBC 0.0 0.0 - 0.2 %    Assessment: Significant anemia this a.m. with hemoglobin 3.8 after hydration. Temperature 101 last night afebrile this morning, questionable etiology.  No overt evidence of infection on exam and symptoms. 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 Discussed case with Dr. Leo Grosser her primary physician who is assuming care.  Anastasio Auerbach MD, 7:41 AM 08/14/2018

## 2018-08-14 NOTE — Anesthesia Procedure Notes (Signed)
Procedure Name: Intubation Date/Time: 08/14/2018 11:27 AM Performed by: Talbot Grumbling, CRNA Pre-anesthesia Checklist: Emergency Drugs available, Suction available, Patient being monitored and Patient identified Patient Re-evaluated:Patient Re-evaluated prior to induction Oxygen Delivery Method: Circle system utilized Preoxygenation: Pre-oxygenation with 100% oxygen Induction Type: IV induction Ventilation: Mask ventilation without difficulty Laryngoscope Size: Mac and 3 Grade View: Grade II Tube type: Oral Tube size: 7.0 mm Number of attempts: 1 Airway Equipment and Method: Stylet Placement Confirmation: ETT inserted through vocal cords under direct vision,  positive ETCO2 and breath sounds checked- equal and bilateral Secured at: 22 cm Tube secured with: Tape Dental Injury: Teeth and Oropharynx as per pre-operative assessment

## 2018-08-14 NOTE — Op Note (Signed)
08/14/2018  12:41 PM  PATIENT:  Katie Mclaughlin  47 y.o. female  PRE-OPERATIVE DIAGNOSIS: Abnormal uterine bleeding POST-OPERATIVE DIAGNOSIS: Abnormal uterine bleeding pROCEDURE:  Procedure(s): DILATATION AND CURETTAGE /HYSTEROSCOPY  SURGEON:  Eldred Manges, MD  ASSISTANTS: None  ANESTHESIA:   general  ESTIMATED BLOOD LOSS: 10 mL   BLOOD ADMINISTERED:. The patient has received a total of 4 units of packed red blood cells since admission.  3 of these were administered prior to the induction of anesthesia and 1 after the induction of anesthesia.  The intraoperative i-STAT value for hemoglobin was 6.8  COMPLICATIONS: None  FINDINGS: The uterus sounded to 10 cm.  The endometrium was generally thickened.  No specific endometrial lesion was noted however there were areas of shaggy endometrium that were lumped together, such that the endometrial lining was inhomogeneous.  There were other areas where the lining appeared quite thin..  Tubal ostia were visualized bilaterally.  A fibroid-like lesion was removed at curettage that measured approximately 1 cm.  There was a large amount of endometrial curetting that was obtained.  FLUID DEFICIT:390  LOCAL MEDICATIONS USED:  LIDOCAINE  and Amount: None ml  SPECIMEN:  Source of Specimen:  Endometrial curettings.  Uterine fibroid  DISPOSITION OF SPECIMEN:  PATHOLOGY as a rush specimen  COUNTS:  YES  DESCRIPTION OF PROCEDURE:the patient was taken to the operating room after appropriate identification and placed on the operating table. After the attainment of adequate general anesthesia she was placed in the lithotomy position. The perineum and vagina were prepped with multiple layers of Betadine. The bladder was emptied with a an in and out catheter. The perineum was draped in sterile field. A graves speculum was placed in the vagina. The cervix was grasped with a single-tooth tenaculum. A paracervical block was achieved with a total of 10 cc of  2% Xylocaine and the 5 and 7:00 positions. The uterus was sounded.  The cervix was then dilated to accommodate the diagnostic hysteroscope. The hysteroscope was used to evaluate all quadrants of the uterus.  The above-noted findings were made and documented.  The uterus was vigorously curetted in all quadrants with the production of a large amount of tissue. All instruments were then removed from the vagina and the patient was awakened from general anesthesia and taken to the recovery room in satisfactory condition having tolerated the procedure well sponge and instrument counts correct.  PLAN OF CARE: The patient will be transferred to the third floor after postanesthesia care.    PATIENT DISPOSITION:  PACU - hemodynamically stable.   Delay start of Pharmacological VTE agent (>24hrs) due to surgical blood loss or risk of bleeding:  yes   Eldred Manges, MD 12:41 PM

## 2018-08-14 NOTE — H&P (Signed)
History and Physical Interval Note:   08/14/2018   11:16 AM S:  The pt presented to the hospital on 08/13/2018 with a weeklong history of heavy vaginal bleeding which had followed 2 weeks of intermittent lighter bleeding.  The patient had regular monthly menses prior to that time but they lasted for 7 days and were quite heavy and associated with cramps.  The patient complained of lightheaded and dizziness but had had no syncope.  She was observed overnight and had a hemoglobin of 3.8 early this a.m.  Transfusion was begun.  The patient has received Megace and tranexamic acid neither of which has allowed decrease in the vaginal bleeding.  Quantitative blood loss reported by nursing is greater than 800 cc between 7 AM and 11 AM.  O:  Temp:  [98.1 F (36.7 C)-101.2 F (38.4 C)] 99.1 F (37.3 C) (11/25 1000) Pulse Rate:  [91-112] 112 (11/25 1100) Resp:  [16-20] 20 (11/25 1000) BP: (94-125)/(53-83) 115/73 (11/25 1100) SpO2:  [93 %-100 %] 100 % (11/25 1000) Weight:  [81.6 kg-81.8 kg] 81.6 kg (11/24 1738)  General: Patient appears pale and has pale nailbeds. Lungs clear.   Heart rhythm regular rate and rhythm with an intermittent S3. Abdomen: Soft without masses.  There is mild tenderness over the suprapubic region. Perineum: At the time of evaluation at approximately 9:45 AM there was a small amount of blood on the perineal pad and minimal drainage.  Since that time the patient has had more significant bleeding and passage of clots. Output by Drain (mL) 08/12/18 0701 - 08/12/18 1900 08/12/18 1901 - 08/13/18 0700 08/13/18 0701 - 08/13/18 1900 08/13/18 1901 - 08/14/18 0700 08/14/18 0701 - 08/14/18 1121    The patient has had adequate urinary out put much greater than the minimum of 30 cc/h. Results for orders placed or performed during the hospital encounter of 08/13/18 (from the past 24 hour(s))  Urinalysis, Routine w reflex microscopic     Status: Abnormal   Collection Time: 08/13/18  2:20 PM   Result Value Ref Range   Color, Urine STRAW (A) YELLOW   APPearance CLEAR CLEAR   Specific Gravity, Urine 1.004 (L) 1.005 - 1.030   pH 8.0 5.0 - 8.0   Glucose, UA NEGATIVE NEGATIVE mg/dL   Hgb urine dipstick LARGE (A) NEGATIVE   Bilirubin Urine NEGATIVE NEGATIVE   Ketones, ur NEGATIVE NEGATIVE mg/dL   Protein, ur NEGATIVE NEGATIVE mg/dL   Nitrite NEGATIVE NEGATIVE   Leukocytes, UA NEGATIVE NEGATIVE   RBC / HPF 0-5 0 - 5 RBC/hpf   WBC, UA 0-5 0 - 5 WBC/hpf   Bacteria, UA NONE SEEN NONE SEEN   Squamous Epithelial / LPF 0-5 0 - 5  Pregnancy, urine POC     Status: None   Collection Time: 08/13/18  2:21 PM  Result Value Ref Range   Preg Test, Ur NEGATIVE NEGATIVE  CBC     Status: Abnormal   Collection Time: 08/13/18  3:24 PM  Result Value Ref Range   WBC 9.7 4.0 - 10.5 K/uL   RBC 2.30 (L) 3.87 - 5.11 MIL/uL   Hemoglobin 4.9 (LL) 12.0 - 15.0 g/dL   HCT 16.7 (L) 36.0 - 46.0 %   MCV 72.6 (L) 80.0 - 100.0 fL   MCH 21.3 (L) 26.0 - 34.0 pg   MCHC 29.3 (L) 30.0 - 36.0 g/dL   RDW 19.8 (H) 11.5 - 15.5 %   Platelets 222 150 - 400 K/uL   nRBC 0.4 (H) 0.0 -  0.2 %  Type and screen     Status: None (Preliminary result)   Collection Time: 08/13/18  3:47 PM  Result Value Ref Range   ABO/RH(D) AB POS    Antibody Screen NEG    Sample Expiration 08/16/2018    Unit Number [id number]    Blood Component Type RED CELLS,LR    Unit division 00    Status of Unit REL FROM [facility]    Transfusion Status OK TO TRANSFUSE    Crossmatch Result Compatible    Unit Number [id number]    Blood Component Type RED CELLS,LR    Unit division 00    Status of Unit REL FROM [facility]    Transfusion Status OK TO TRANSFUSE    Crossmatch Result Compatible    Unit Number [id number]    Blood Component Type RED CELLS,LR    Unit division 00    Status of Unit ISSUED    Transfusion Status OK TO TRANSFUSE    Crossmatch Result Compatible    Unit Number [id number]    Blood Component Type RED CELLS,LR    Unit  division 00    Status of Unit ISSUED    Transfusion Status OK TO TRANSFUSE    Crossmatch Result Compatible    Unit Number [id number]    Blood Component Type RED CELLS,LR    Unit division 00    Status of Unit ALLOCATED    Transfusion Status OK TO TRANSFUSE    Crossmatch Result Compatible    Unit Number [id number]    Blood Component Type RED CELLS,LR    Unit division 00    Status of Unit REL FROM [facility]    Transfusion Status OK TO TRANSFUSE    Crossmatch Result      Compatible Performed at [facility], [address] [place], [place] [address]    Unit Number [id number]    Blood Component Type RED CELLS,LR    Unit division 00    Status of Unit ISSUED    Transfusion Status OK TO TRANSFUSE    Crossmatch Result Compatible   ABO/Rh     Status: None   Collection Time: 08/13/18  4:10 PM  Result Value Ref Range   ABO/RH(D)      AB POS Performed at [facility], [address] [place], [place] [address]   CBC     Status: Abnormal   Collection Time: 08/13/18  4:11 PM  Result Value Ref Range   WBC 10.1 4.0 - 10.5 K/uL   RBC 2.43 (L) 3.87 - 5.11 MIL/uL   Hemoglobin 5.2 (LL) 12.0 - 15.0 g/dL   HCT 17.8 (L) 36.0 - 46.0 %   MCV 73.3 (L) 80.0 - 100.0 fL   MCH 21.4 (L) 26.0 - 34.0 pg   MCHC 29.2 (L) 30.0 - 36.0 g/dL   RDW 20.0 (H) 11.5 - 15.5 %   Platelets 243 150 - 400 K/uL   nRBC 0.3 (H) 0.0 - 0.2 %  Prepare RBC     Status: None   Collection Time: 08/13/18  4:11 PM  Result Value Ref Range   Order Confirmation      ORDER PROCESSED BY BLOOD BANK Performed at [facility], [address] [place], [place] [address]   CBC     Status: Abnormal   Collection Time: 08/14/18  5:34 AM  Result Value Ref Range   WBC 8.7 4.0 - 10.5 K/uL   RBC 1.74 (L) 3.87 - 5.11 MIL/uL   Hemoglobin 3.8 (LL) 12.0 -  15.0 g/dL   HCT 12.7 (L) 36.0 - 46.0 %   MCV 73.0 (L) 80.0 - 100.0 fL   MCH 21.8 (L) 26.0 - 34.0 pg   MCHC 29.9 (L) 30.0 - 36.0 g/dL   RDW 20.6 (H) 11.5 - 15.5 %    Platelets 201 150 - 400 K/uL   nRBC 0.0 0.0 - 0.2 %  Prepare RBC     Status: None   Collection Time: 08/14/18  7:33 AM  Result Value Ref Range   Order Confirmation      ORDER PROCESSED BY BLOOD BANK Performed at [facility], [address], [place], [place] [address]     Assessment: 1) menorrhagia 2) anemia of acute blood loss Rubley superimposed over chronic blood loss of chronic menorrhagia 3) ultrasound evidence of submucosal myoma  Recommendation: The patient is currently receiving her third unit of packed red blood cells. A long discussion was held with the patient concerning surgical intervention with hysteroscopy D&C and resection of intrauterine mass.  The risks of anesthesia bleeding infection damage to adjacent organs and uterine perforation are all reviewed.  I have reviewed the possible need for hysterectomy but that that is a very remote possibility.   The patient speaks good English but also has a Presenter, broadcasting interpreter speaks her native language present.  She has designated her husband as her healthcare power of attorney. The patient wishes to proceed with hysteroscopy D&C.   Amberley Rauh  has presented today for surgery, with the diagnosis of intrauterine bleeding  The various methods of treatment have been discussed with the patient and family. After consideration of risks, benefits and other options for treatment, the patient has consented to  Procedure(s): DILATATION AND CURETTAGE /HYSTEROSCOPY as a surgical intervention .  I have reviewed the patients' chart and labs.  Questions were answered to the patient's satisfaction.     Eldred Manges  MD

## 2018-08-15 ENCOUNTER — Encounter (HOSPITAL_COMMUNITY): Payer: Self-pay | Admitting: Obstetrics and Gynecology

## 2018-08-15 LAB — TYPE AND SCREEN
ABO/RH(D): AB POS
ANTIBODY SCREEN: NEGATIVE
UNIT DIVISION: 0
UNIT DIVISION: 0
UNIT DIVISION: 0
UNIT DIVISION: 0
Unit division: 0
Unit division: 0
Unit division: 0
Unit division: 0
Unit division: 0
Unit division: 0

## 2018-08-15 LAB — BPAM RBC
BLOOD PRODUCT EXPIRATION DATE: 201912032359
BLOOD PRODUCT EXPIRATION DATE: 201912042359
BLOOD PRODUCT EXPIRATION DATE: 201912082359
BLOOD PRODUCT EXPIRATION DATE: 201912122359
Blood Product Expiration Date: 201912062359
Blood Product Expiration Date: 201912082359
Blood Product Expiration Date: 201912122359
Blood Product Expiration Date: 201912162359
Blood Product Expiration Date: 201912182359
Blood Product Expiration Date: 201912182359
ISSUE DATE / TIME: 201911250244
ISSUE DATE / TIME: 201911250751
ISSUE DATE / TIME: 201911251026
ISSUE DATE / TIME: 201911251121
ISSUE DATE / TIME: 201911260943
ISSUE DATE / TIME: 201911211410
ISSUE DATE / TIME: 201911250531
ISSUE DATE / TIME: 201911250610
ISSUE DATE / TIME: 201911260737
UNIT TYPE AND RH: 5100
UNIT TYPE AND RH: 600
UNIT TYPE AND RH: 9500
UNIT TYPE AND RH: 9500
Unit Type and Rh: 5100
Unit Type and Rh: 600
Unit Type and Rh: 600
Unit Type and Rh: 6200
Unit Type and Rh: 6200
Unit Type and Rh: 9500

## 2018-08-15 LAB — CBC WITH DIFFERENTIAL/PLATELET
BASOS ABS: 0 10*3/uL (ref 0.0–0.1)
Basophils Relative: 0 %
EOS ABS: 0 10*3/uL (ref 0.0–0.5)
EOS PCT: 0 %
HCT: 25.8 % — ABNORMAL LOW (ref 36.0–46.0)
Hemoglobin: 8.6 g/dL — ABNORMAL LOW (ref 12.0–15.0)
LYMPHS ABS: 3 10*3/uL (ref 0.7–4.0)
Lymphocytes Relative: 20 %
MCH: 27 pg (ref 26.0–34.0)
MCHC: 33.3 g/dL (ref 30.0–36.0)
MCV: 80.9 fL (ref 80.0–100.0)
Monocytes Absolute: 0.8 10*3/uL (ref 0.1–1.0)
Monocytes Relative: 5 %
NRBC: 0.6 % — AB (ref 0.0–0.2)
Neutro Abs: 10.9 10*3/uL — ABNORMAL HIGH (ref 1.7–7.7)
Neutrophils Relative %: 75 %
PLATELETS: 190 10*3/uL (ref 150–400)
RBC: 3.19 MIL/uL — ABNORMAL LOW (ref 3.87–5.11)
RDW: 19.3 % — ABNORMAL HIGH (ref 11.5–15.5)
WBC: 14.7 10*3/uL — ABNORMAL HIGH (ref 4.0–10.5)

## 2018-08-15 LAB — TSH: TSH: 0.863 u[IU]/mL (ref 0.350–4.500)

## 2018-08-15 LAB — T4, FREE: Free T4: 0.9 ng/dL (ref 0.82–1.77)

## 2018-08-15 MED ORDER — IBUPROFEN 600 MG PO TABS: ORAL_TABLET | ORAL | 0 refills | Status: DC

## 2018-08-15 MED ORDER — DOXYCYCLINE HYCLATE 100 MG PO CAPS: 100.0000 mg | ORAL_CAPSULE | Freq: Two times a day (BID) | ORAL | 0 refills | Status: AC

## 2018-08-15 MED ORDER — MEDROXYPROGESTERONE ACETATE 10 MG PO TABS: 40.0000 mg | ORAL_TABLET | Freq: Every day | ORAL | 3 refills | Status: DC

## 2018-08-15 NOTE — Anesthesia Postprocedure Evaluation (Signed)
Anesthesia Post Note  Patient: Forestine Cecilio  Procedure(s) Performed: DILATATION AND CURETTAGE /HYSTEROSCOPY (N/A Vagina )     Patient location during evaluation: [facility] Anesthesia Type: General Level of consciousness: awake, awake and alert and oriented Pain management: pain level controlled Vital Signs Assessment: post-procedure vital signs reviewed and stable Respiratory status: spontaneous breathing, nonlabored ventilation and respiratory function stable Cardiovascular status: stable Postop Assessment: no apparent nausea or vomiting, adequate PO intake, no headache, no backache and patient able to bend at knees Anesthetic complications: no    Last Vitals:  Vitals:   08/15/18 0508 08/15/18 0748  BP: (!) 97/54 109/69  Pulse: 80 71  Resp: 18 18  Temp: 36.9 C 37.1 C  SpO2: 100% 100%    Last Pain:  Vitals:   08/15/18 0755  TempSrc:   PainSc: 0-No pain   Pain Goal: Patients Stated Pain Goal: 0 (08/13/18 1910)               Shamina Etheridge

## 2018-08-15 NOTE — Discharge Summary (Signed)
Physician Discharge Summary  Patient ID: Katie Mclaughlin MRN: [id number] DOB/AGE: 06/19/1971 47 y.o.  Admit date: 08/13/2018 Discharge date: 08/15/2018  Admission Diagnoses: Abnormal uterine bleeding      Anemiia  Discharge Diagnoses:  Principal Problem:   Menorrhagia Active Problems:   Anemia associated with acute blood loss   Submucous uterine fibroid   Hyperthyroidism   Abnormal uterine bleeding   Discharged Condition: Good and improved  Hospital Course: The patient was admitted to the hospital on 08/13/2018 with a one-week history of heavy vaginal bleeding.  This had been preceded by a normal menstrual cycle towards the end of October followed by weeks of intermittent spotting.  He had what she thought was a normal.  Around mid November but week prior to admission the bleeding became heavier and heavier rather than tapering off.  She had never had an episode of bleeding similar to this.  She was admitted, hydrated and observed overnight with a significant drop in her hemoglobin to a level of 3.8.  Iron infusion was given and transfusion begun.  He was also started on Megace and given a single dose of tranexamic acid all without benefit such that her very heavy bleeding continued.  She had a negative pregnancy test and a pelvic ultrasound suggestive of a submucosal fibroid.  Was recommended that she undergo hysteroscopy resection of the fibroid and D&C.  The risks and benefits were explained and the patient accepted that recommendation.  She underwent her procedure on 08/14/2018 and subsequent to that had minimal bleeding.  She was transfused a total of 4 units of packed red blood cell with a post transfusion hemoglobin that stabilized at 8.6.  On the day after surgery she was ambulatory without dizziness tolerating a regular diet, had excellent urine output and good bowel function with bowel movement.  She was thus deemed to be ready for discharge home.  It should be noted that the patient  had been diagnosed previously with hyperthyroidism and was treated with methimazole which she discontinued approximately 2 years ago without physicians supervision.  She thus had her weight studies drawn just prior to discharge with instructions to follow-up with her primary care physician who would be able to access those results.  Consults: None  Significant Diagnostic Studies: radiology: Ultrasound: As noted above  Treatments: IV hydration, antibiotics: Cefotetan for surgery prophylaxis and surgery:   Discharge Exam: Blood pressure 109/69, pulse 71, temperature 98.7 F (37.1 C), temperature source Oral, resp. rate 18, height 5\' 3"  (1.6 m), weight 81.6 kg, last menstrual period 07/23/2018, SpO2 100 %. General appearance: alert, cooperative, appears stated age, no distress and mildly obese GI: soft, non-tender; bowel sounds normal; no masses,  no organomegaly Extremities: extremities normal, atraumatic, no cyanosis or edema  Vaginal bleeding minimal  Disposition: Discharge disposition: 01-Home or Self Care        Allergies as of 08/15/2018   No Known Allergies     Medication List    TAKE these medications   doxycycline 100 MG capsule Commonly known as:  VIBRAMYCIN Take 1 capsule (100 mg total) by mouth 2 (two) times daily for 5 days.   ferrous sulfate 325 (65 FE) MG tablet Take 325 mg by mouth 2 (two) times daily with a meal.   ibuprofen 600 MG tablet Commonly known as:  ADVIL,MOTRIN 1 tablet every 6 hours for 3 days, then every 6 hrs as needed for pain   medroxyPROGESTERone 10 MG tablet Commonly known as:  PROVERA Take 4 tablets (40 mg  total) by mouth daily.      Follow-up Information    Carolena Fairbank, Maris Berger, MD Follow up.   Specialty:  Obstetrics and Gynecology Why:  Appt is 08/24/18 at 1230pm.  Please call at least 48 hours in advance if you are unable to keep this appt. Contact information: [address] [place] [address] [phone]         Plotnikov, Georgina Quint, MD Follow up in 1 month(s).   Specialty:  Internal Medicine Why:  please make appt to followup thyroid managment Contact information: [address] [place] [address] [phone]           Signed: Hal Morales 08/15/2018, 9:58 AM

## 2018-08-15 NOTE — Addendum Note (Signed)
Addendum  created 08/15/18 0855 by Elenore Paddy, CRNA   Sign clinical note

## 2018-08-15 NOTE — Plan of Care (Signed)
Patient being discharged

## 2018-08-15 NOTE — Plan of Care (Signed)
Patient being discharged home.

## 2018-08-15 NOTE — Discharge Instructions (Signed)
Abnormal Uterine Bleeding Abnormal uterine bleeding can affect women at various stages in life, including teenagers, women in their reproductive years, pregnant women, and women who have reached menopause. Several kinds of uterine bleeding are considered abnormal, including:  Bleeding or spotting between periods.  Bleeding after sexual intercourse.  Bleeding that is heavier or more than normal.  Periods that last longer than usual.  Bleeding after menopause.  Many cases of abnormal uterine bleeding are minor and simple to treat, while others are more serious. Any type of abnormal bleeding should be evaluated by your health care provider. Treatment will depend on the cause of the bleeding. Follow these instructions at home: Monitor your condition for any changes. The following actions may help to alleviate any discomfort you are experiencing:  Avoid the use of tampons and douches as directed by your health care provider.  Change your pads frequently.  You should get regular pelvic exams and Pap tests. Keep all follow-up appointments for diagnostic tests as directed by your health care provider. Contact a health care provider if:  Your bleeding lasts more than 1 week.  You feel dizzy at times. Get help right away if:  You pass out.  You are changing pads every 15 to 30 minutes.  You have abdominal pain.  You have a fever.  You become sweaty or weak.  You are passing large blood clots from the vagina.  You start to feel nauseous and vomit. This information is not intended to replace advice given to you by your health care provider. Make sure you discuss any questions you have with your health care provider. Document Released: 09/06/2005 Document Revised: 02/18/2016 Document Reviewed: 04/05/2013 Elsevier Interactive Patient Education  2017 [employer]. Levonorgestrel intrauterine device (IUD) What is this medicine? LEVONORGESTREL IUD (LEE voe nor jes trel) is a  contraceptive (birth control) device. The device is placed inside the uterus by a healthcare professional. It is used to prevent pregnancy. This device can also be used to treat heavy bleeding that occurs during your period. This medicine may be used for other purposes; ask your health care provider or pharmacist if you have questions. COMMON BRAND NAME(S): Minette Headland What should I tell my health care provider before I take this medicine? They need to know if you have any of these conditions: -abnormal Pap smear -cancer of the breast, uterus, or cervix -diabetes -endometritis -genital or pelvic infection now or in the past -have more than one sexual partner or your partner has more than one partner -heart disease -history of an ectopic or tubal pregnancy -immune system problems -IUD in place -liver disease or tumor -problems with blood clots or take blood-thinners -seizures -use intravenous drugs -uterus of unusual shape -vaginal bleeding that has not been explained -an unusual or allergic reaction to levonorgestrel, other hormones, silicone, or polyethylene, medicines, foods, dyes, or preservatives -pregnant or trying to get pregnant -breast-feeding How should I use this medicine? This device is placed inside the uterus by a health care professional. Talk to your pediatrician regarding the use of this medicine in children. Special care may be needed. Overdosage: If you think you have taken too much of this medicine contact a poison control center or emergency room at once. NOTE: This medicine is only for you. Do not share this medicine with others. What if I miss a dose? This does not apply. Depending on the brand of device you have inserted, the device will need to be replaced every 3 to 5  years if you wish to continue using this type of birth control. What may interact with this medicine? Do not take this medicine with any of the following  medications: -amprenavir -bosentan -fosamprenavir This medicine may also interact with the following medications: -aprepitant -armodafinil -barbiturate medicines for inducing sleep or treating seizures -bexarotene -boceprevir -griseofulvin -medicines to treat seizures like carbamazepine, ethotoin, felbamate, oxcarbazepine, phenytoin, topiramate -modafinil -pioglitazone -rifabutin -rifampin -rifapentine -some medicines to treat HIV infection like atazanavir, efavirenz, indinavir, lopinavir, nelfinavir, tipranavir, ritonavir -St. John's wort -warfarin This list may not describe all possible interactions. Give your health care provider a list of all the medicines, herbs, non-prescription drugs, or dietary supplements you use. Also tell them if you smoke, drink alcohol, or use illegal drugs. Some items may interact with your medicine. What should I watch for while using this medicine? Visit your doctor or health care professional for regular check ups. See your doctor if you or your partner has sexual contact with others, becomes HIV positive, or gets a sexual transmitted disease. This product does not protect you against HIV infection (AIDS) or other sexually transmitted diseases. You can check the placement of the IUD yourself by reaching up to the top of your vagina with clean fingers to feel the threads. Do not pull on the threads. It is a good habit to check placement after each menstrual period. Call your doctor right away if you feel more of the IUD than just the threads or if you cannot feel the threads at all. The IUD may come out by itself. You may become pregnant if the device comes out. If you notice that the IUD has come out use a backup birth control method like condoms and call your health care provider. Using tampons will not change the position of the IUD and are okay to use during your period. This IUD can be safely scanned with magnetic resonance imaging (MRI) only under  specific conditions. Before you have an MRI, tell your healthcare provider that you have an IUD in place, and which type of IUD you have in place. What side effects may I notice from receiving this medicine? Side effects that you should report to your doctor or health care professional as soon as possible: -allergic reactions like skin rash, itching or hives, swelling of the face, lips, or tongue -fever, flu-like symptoms -genital sores -high blood pressure -no menstrual period for 6 weeks during use -pain, swelling, warmth in the leg -pelvic pain or tenderness -severe or sudden headache -signs of pregnancy -stomach cramping -sudden shortness of breath -trouble with balance, talking, or walking -unusual vaginal bleeding, discharge -yellowing of the eyes or skin Side effects that usually do not require medical attention (report to your doctor or health care professional if they continue or are bothersome): -acne -breast pain -change in sex drive or performance -changes in weight -cramping, dizziness, or faintness while the device is being inserted -headache -irregular menstrual bleeding within first 3 to 6 months of use -nausea This list may not describe all possible side effects. Call your doctor for medical advice about side effects. You may report side effects to FDA at 1-800-FDA-1088. Where should I keep my medicine? This does not apply. NOTE: This sheet is a summary. It may not cover all possible information. If you have questions about this medicine, talk to your doctor, pharmacist, or health care provider.  2018 Elsevier/Gold Standard (2016-06-18 14:14:56) Hysteroscopy, Care After Refer to this sheet in the next few weeks. These instructions provide  you with information on caring for yourself after your procedure. Your health care provider may also give you more specific instructions. Your treatment has been planned according to current medical practices, but problems sometimes  occur. Call your health care provider if you have any problems or questions after your procedure. What can I expect after the procedure? After your procedure, it is typical to have the following:  You may have some cramping. This normally lasts for a couple days.  You may have bleeding. This can vary from light spotting for a few days to menstrual-like bleeding for 3-7 days.  Follow these instructions at home:  Rest for the first 1-2 days after the procedure.  Only take over-the-counter or prescription medicines as directed by your health care provider. Do not take aspirin. It can increase the chances of bleeding.  Take showers instead of baths for 2 weeks or as directed by your health care provider.  Do not drive for 24 hours or as directed.  Do not drink alcohol while taking pain medicine.  Do not use tampons, douche, or have sexual intercourse for 2 weeks or until your health care provider says it is okay.  Take your temperature twice a day for 4-5 days. Write it down each time.  Follow your health care provider's advice about diet, exercise, and lifting.  If you develop constipation, you may: ? Take a mild laxative if your health care provider approves. ? Add bran foods to your diet. ? Drink enough fluids to keep your urine clear or pale yellow.  Try to have someone with you or available to you for the first 24-48 hours, especially if you were given a general anesthetic.  Follow up with your health care provider as directed. Contact a health care provider if:  You feel dizzy or lightheaded.  You feel sick to your stomach (nauseous).  You have abnormal vaginal discharge.  You have a rash.  You have pain that is not controlled with medicine. Get help right away if:  You have bleeding that is heavier than a normal menstrual period.  You have a fever.  You have increasing cramps or pain, not controlled with medicine.  You have new belly (abdominal) pain.  You  pass out.  You have pain in the tops of your shoulders (shoulder strap areas).  You have shortness of breath. This information is not intended to replace advice given to you by your health care provider. Make sure you discuss any questions you have with your health care provider. Document Released: 06/27/2013 Document Revised: 02/12/2016 Document Reviewed: 04/05/2013 Elsevier Interactive Patient Education  2017 [employer].

## 2018-08-16 LAB — T3, FREE: T3, Free: 1.5 pg/mL — ABNORMAL LOW (ref 2.0–4.4)

## 2018-08-16 LAB — T3 UPTAKE: T3 UPTAKE RATIO: 27 % (ref 24–39)

## 2019-02-20 DIAGNOSIS — D259 Leiomyoma of uterus, unspecified: Secondary | ICD-10-CM | POA: Diagnosis not present

## 2019-02-20 DIAGNOSIS — R102 Pelvic and perineal pain: Secondary | ICD-10-CM | POA: Diagnosis not present

## 2019-04-11 DIAGNOSIS — M25562 Pain in left knee: Secondary | ICD-10-CM | POA: Diagnosis not present

## 2019-04-11 DIAGNOSIS — M25462 Effusion, left knee: Secondary | ICD-10-CM | POA: Diagnosis not present

## 2019-06-07 ENCOUNTER — Ambulatory Visit (INDEPENDENT_AMBULATORY_CARE_PROVIDER_SITE_OTHER): Payer: BC Managed Care – PPO | Admitting: Internal Medicine

## 2019-06-07 ENCOUNTER — Encounter: Payer: Self-pay | Admitting: Internal Medicine

## 2019-06-07 ENCOUNTER — Other Ambulatory Visit (INDEPENDENT_AMBULATORY_CARE_PROVIDER_SITE_OTHER): Payer: BC Managed Care – PPO

## 2019-06-07 ENCOUNTER — Other Ambulatory Visit: Payer: Self-pay

## 2019-06-07 VITALS — BP 126/78 | HR 97 | Temp 99.2°F | Ht 63.0 in | Wt 194.0 lb

## 2019-06-07 DIAGNOSIS — D62 Acute posthemorrhagic anemia: Secondary | ICD-10-CM

## 2019-06-07 DIAGNOSIS — N924 Excessive bleeding in the premenopausal period: Secondary | ICD-10-CM

## 2019-06-07 DIAGNOSIS — E0590 Thyrotoxicosis, unspecified without thyrotoxic crisis or storm: Secondary | ICD-10-CM | POA: Diagnosis not present

## 2019-06-07 DIAGNOSIS — R296 Repeated falls: Secondary | ICD-10-CM

## 2019-06-07 DIAGNOSIS — Z0000 Encounter for general adult medical examination without abnormal findings: Secondary | ICD-10-CM

## 2019-06-07 LAB — URINALYSIS
Bilirubin Urine: NEGATIVE
Hgb urine dipstick: NEGATIVE
Ketones, ur: NEGATIVE
Leukocytes,Ua: NEGATIVE
Nitrite: NEGATIVE
Specific Gravity, Urine: 1.01 (ref 1.000–1.030)
Total Protein, Urine: NEGATIVE
Urine Glucose: NEGATIVE
Urobilinogen, UA: 0.2 (ref 0.0–1.0)
pH: 6 (ref 5.0–8.0)

## 2019-06-07 LAB — CBC WITH DIFFERENTIAL/PLATELET
Basophils Absolute: 0 10*3/uL (ref 0.0–0.1)
Basophils Relative: 0.5 % (ref 0.0–3.0)
Eosinophils Absolute: 0.1 10*3/uL (ref 0.0–0.7)
Eosinophils Relative: 1.6 % (ref 0.0–5.0)
HCT: 40.4 % (ref 36.0–46.0)
Hemoglobin: 13.6 g/dL (ref 12.0–15.0)
Lymphocytes Relative: 24.9 % (ref 12.0–46.0)
Lymphs Abs: 2 10*3/uL (ref 0.7–4.0)
MCHC: 33.7 g/dL (ref 30.0–36.0)
MCV: 91.5 fl (ref 78.0–100.0)
Monocytes Absolute: 0.6 10*3/uL (ref 0.1–1.0)
Monocytes Relative: 7.5 % (ref 3.0–12.0)
Neutro Abs: 5.3 10*3/uL (ref 1.4–7.7)
Neutrophils Relative %: 65.5 % (ref 43.0–77.0)
Platelets: 219 10*3/uL (ref 150.0–400.0)
RBC: 4.41 Mil/uL (ref 3.87–5.11)
RDW: 13.5 % (ref 11.5–15.5)
WBC: 8.2 10*3/uL (ref 4.0–10.5)

## 2019-06-07 LAB — BASIC METABOLIC PANEL
BUN: 12 mg/dL (ref 6–23)
CO2: 29 mEq/L (ref 19–32)
Calcium: 9.5 mg/dL (ref 8.4–10.5)
Chloride: 101 mEq/L (ref 96–112)
Creatinine, Ser: 0.6 mg/dL (ref 0.40–1.20)
GFR: 106.91 mL/min (ref >60.00)
Glucose, Bld: 81 mg/dL (ref 70–99)
Potassium: 3.7 mEq/L (ref 3.5–5.1)
Sodium: 137 mEq/L (ref 135–145)

## 2019-06-07 LAB — VITAMIN B12: Vitamin B-12: 357 pg/mL (ref 211–911)

## 2019-06-07 LAB — LIPID PANEL
Cholesterol: 227 mg/dL — ABNORMAL HIGH (ref 0–200)
HDL: 33.3 mg/dL — ABNORMAL LOW (ref >39.00)
LDL Cholesterol: 155 mg/dL — ABNORMAL HIGH (ref 0–99)
NonHDL: 193.77
Total CHOL/HDL Ratio: 7
Triglycerides: 193 mg/dL — ABNORMAL HIGH (ref 0.0–149.0)
VLDL: 38.6 mg/dL (ref 0.0–40.0)

## 2019-06-07 LAB — HEPATIC FUNCTION PANEL
ALT: 19 U/L (ref 0–35)
AST: 16 U/L (ref 0–37)
Albumin: 4.3 g/dL (ref 3.5–5.2)
Alkaline Phosphatase: 64 U/L (ref 39–117)
Bilirubin, Direct: 0 mg/dL (ref 0.0–0.3)
Total Bilirubin: 0.4 mg/dL (ref 0.2–1.2)
Total Protein: 7.1 g/dL (ref 6.0–8.3)

## 2019-06-07 LAB — VITAMIN D 25 HYDROXY (VIT D DEFICIENCY, FRACTURES): VITD: 19.56 ng/mL — ABNORMAL LOW (ref 30.00–100.00)

## 2019-06-07 LAB — TSH: TSH: 0.89 u[IU]/mL (ref 0.35–4.50)

## 2019-06-07 LAB — T4, FREE: Free T4: 0.96 ng/dL (ref 0.60–1.60)

## 2019-06-07 MED ORDER — LORATADINE 10 MG PO TABS: 10.0000 mg | ORAL_TABLET | Freq: Every day | ORAL | 11 refills | Status: DC

## 2019-06-07 MED ORDER — ERYTHROMYCIN 5 MG/GM OP OINT: 1.0000 "application " | TOPICAL_OINTMENT | Freq: Three times a day (TID) | OPHTHALMIC | 0 refills | Status: DC

## 2019-06-07 MED ORDER — CEPHALEXIN 500 MG PO CAPS: 500.0000 mg | ORAL_CAPSULE | Freq: Four times a day (QID) | ORAL | 1 refills | Status: DC

## 2019-06-07 NOTE — Assessment & Plan Note (Addendum)
Uterine polyps, fibroma 2020 Dr Leo Grosser On iron Labs

## 2019-06-07 NOTE — Assessment & Plan Note (Signed)
Resolved post-op 

## 2019-06-07 NOTE — Assessment & Plan Note (Signed)
We discussed age appropriate health related issues, including available/recomended screening tests and vaccinations. We discussed a need for adhering to healthy diet and exercise. Labs were ordered to be later reviewed . All questions were answered.   

## 2019-06-07 NOTE — Progress Notes (Signed)
Subjective:  Patient ID: Katie Mclaughlin, female    DOB: 1970/12/26  Age: 48 y.o. MRN: [id number]  CC: No chief complaint on file.   HPI Valeree Iden presents for a well exam C/o falls, numbness F/u anemia, vag bleeding C/o eye infection   Outpatient Medications Prior to Visit  Medication Sig Dispense Refill  . ferrous sulfate 325 (65 FE) MG tablet Take 325 mg by mouth 2 (two) times daily with a meal.    . medroxyPROGESTERone (PROVERA) 10 MG tablet Take 4 tablets (40 mg total) by mouth daily. 120 tablet 3  . ibuprofen (ADVIL,MOTRIN) 600 MG tablet 1 tablet every 6 hours for 3 days, then every 6 hrs as needed for pain 60 tablet 0   No facility-administered medications prior to visit.     ROS: Review of Systems  Constitutional: Negative for activity change, appetite change, chills, fatigue and unexpected weight change.  HENT: Negative for congestion, mouth sores and sinus pressure.   Eyes: Negative for visual disturbance.  Respiratory: Negative for cough and chest tightness.   Gastrointestinal: Negative for abdominal pain and nausea.  Genitourinary: Negative for difficulty urinating, frequency and vaginal pain.  Musculoskeletal: Negative for back pain and gait problem.  Skin: Negative for pallor and rash.  Neurological: Negative for dizziness, tremors, weakness, numbness and headaches.  Psychiatric/Behavioral: Negative for confusion, sleep disturbance and suicidal ideas.    Objective:  BP 126/78 (BP Location: Left Arm, Patient Position: Sitting, Cuff Size: Large)   Pulse 97   Temp 99.2 F (37.3 C) (Oral)   Ht 5\' 3"  (1.6 m)   Wt 194 lb (88 kg)   SpO2 98%   BMI 34.37 kg/m   BP Readings from Last 3 Encounters:  06/07/19 126/78  08/15/18 109/69  02/16/17 117/84    Wt Readings from Last 3 Encounters:  06/07/19 194 lb (88 kg)  08/13/18 180 lb (81.6 kg)  10/04/16 176 lb (79.8 kg)    Physical Exam Constitutional:      General: She is not in acute distress.  Appearance: She is well-developed.  HENT:     Head: Normocephalic.     Right Ear: External ear normal.     Left Ear: External ear normal.     Nose: Nose normal.  Eyes:     General:        Right eye: No discharge.        Left eye: No discharge.     Conjunctiva/sclera: Conjunctivae normal.     Pupils: Pupils are equal, round, and reactive to light.  Neck:     Musculoskeletal: Normal range of motion and neck supple.     Thyroid: No thyromegaly.     Vascular: No JVD.     Trachea: No tracheal deviation.  Cardiovascular:     Rate and Rhythm: Normal rate and regular rhythm.     Heart sounds: Normal heart sounds.  Pulmonary:     Effort: No respiratory distress.     Breath sounds: No stridor. No wheezing.  Abdominal:     General: Bowel sounds are normal. There is no distension.     Palpations: Abdomen is soft. There is no mass.     Tenderness: There is no abdominal tenderness. There is no guarding or rebound.  Musculoskeletal:        General: No tenderness.  Lymphadenopathy:     Cervical: No cervical adenopathy.  Skin:    Findings: No erythema or rash.  Neurological:     Cranial Nerves: No  cranial nerve deficit.     Motor: No abnormal muscle tone.     Coordination: Coordination normal.     Deep Tendon Reflexes: Reflexes normal.  Psychiatric:        Behavior: Behavior normal.        Thought Content: Thought content normal.        Judgment: Judgment normal.   stye L lower eyelid  Lab Results  Component Value Date   WBC 14.7 (H) 08/15/2018   HGB 8.6 (L) 08/15/2018   HCT 25.8 (L) 08/15/2018   PLT 190 08/15/2018   GLUCOSE 100 (H) 10/04/2016   CHOL 186 10/04/2016   TRIG 79.0 10/04/2016   HDL 37.90 (L) 10/04/2016   LDLDIRECT 184.8 09/18/2012   LDLCALC 132 (H) 10/04/2016   ALT 16 10/04/2016   AST 14 10/04/2016   NA 140 08/14/2018   K 3.9 08/14/2018   CL 103 10/04/2016   CREATININE 0.62 10/04/2016   BUN 10 10/04/2016   CO2 27 10/04/2016   TSH 0.863 08/15/2018    US  Pelvis Transvanginal Non-ob (tv Only)  Result Date: 08/13/2018 CLINICAL DATA:  Menorrhagia.  Anemia.  LMP 07/23/2018. EXAM: ULTRASOUND PELVIS TRANSVAGINAL TECHNIQUE: Transvaginal ultrasound examination of the pelvis was performed including evaluation of the uterus, ovaries, adnexal regions, and pelvic cul-de-sac. COMPARISON:  None. FINDINGS: Uterus Measurements: 11.3 x 4.4 x 6.5 cm = volume: 168 mL. A submucosal fibroid is seen in the posterior lower uterine corpus which measures 2.2 cm. Endometrium Thickness: 9 mm. Extrinsic mass effect from submucosal fibroid described above. No other focal abnormality visualized. Right ovary Measurements: 3.7 x 2.3 x 2.2 cm = volume: 9.9 mL. Normal appearance/no adnexal mass. Left ovary Measurements: 2.9 x 2.2 x 2.3 cm = volume: 7.7 mL. Normal appearance/no adnexal mass. Other findings:  No abnormal free fluid IMPRESSION: 2.2 cm submucosal fibroid in the posterior lower uterine corpus. Normal appearance of both ovaries. Electronically Signed   By: Earle Gell M.D.   On: 08/13/2018 20:36    Assessment & Plan:   There are no diagnoses linked to this encounter.   No orders of the defined types were placed in this encounter.    Follow-up: No follow-ups on file.  Walker Kehr, MD

## 2019-06-07 NOTE — Assessment & Plan Note (Signed)
Remote Labs 

## 2019-06-07 NOTE — Patient Instructions (Signed)

## 2019-06-08 LAB — IRON,TIBC AND FERRITIN PANEL
%SAT: 33 % (calc) (ref 16–45)
Ferritin: 94 ng/mL (ref 16–232)
Iron: 107 ug/dL (ref 40–190)
TIBC: 322 mcg/dL (calc) (ref 250–450)

## 2019-06-09 ENCOUNTER — Other Ambulatory Visit: Payer: Self-pay | Admitting: Internal Medicine

## 2019-06-09 DIAGNOSIS — E559 Vitamin D deficiency, unspecified: Secondary | ICD-10-CM | POA: Insufficient documentation

## 2019-06-09 MED ORDER — VITAMIN D3 50 MCG (2000 UT) PO CAPS: 2000.0000 [IU] | ORAL_CAPSULE | Freq: Every day | ORAL | 3 refills | Status: DC

## 2019-06-09 MED ORDER — VITAMIN D3 1.25 MG (50000 UT) PO CAPS: 1.0000 | ORAL_CAPSULE | ORAL | 0 refills | Status: DC

## 2019-06-29 DIAGNOSIS — Z1231 Encounter for screening mammogram for malignant neoplasm of breast: Secondary | ICD-10-CM | POA: Diagnosis not present

## 2019-06-29 DIAGNOSIS — Z1151 Encounter for screening for human papillomavirus (HPV): Secondary | ICD-10-CM | POA: Diagnosis not present

## 2019-06-29 DIAGNOSIS — Z6835 Body mass index (BMI) 35.0-35.9, adult: Secondary | ICD-10-CM | POA: Diagnosis not present

## 2019-06-29 DIAGNOSIS — Z01419 Encounter for gynecological examination (general) (routine) without abnormal findings: Secondary | ICD-10-CM | POA: Diagnosis not present

## 2019-07-05 ENCOUNTER — Other Ambulatory Visit: Payer: Self-pay | Admitting: Obstetrics and Gynecology

## 2019-07-05 DIAGNOSIS — D251 Intramural leiomyoma of uterus: Secondary | ICD-10-CM | POA: Diagnosis not present

## 2019-07-05 DIAGNOSIS — N939 Abnormal uterine and vaginal bleeding, unspecified: Secondary | ICD-10-CM | POA: Diagnosis not present

## 2019-07-05 DIAGNOSIS — D250 Submucous leiomyoma of uterus: Secondary | ICD-10-CM | POA: Diagnosis not present

## 2019-09-10 ENCOUNTER — Other Ambulatory Visit: Payer: Self-pay | Admitting: Internal Medicine

## 2019-10-10 ENCOUNTER — Other Ambulatory Visit: Payer: Self-pay | Admitting: Internal Medicine

## 2019-10-10 DIAGNOSIS — E785 Hyperlipidemia, unspecified: Secondary | ICD-10-CM

## 2019-10-17 ENCOUNTER — Encounter: Payer: Self-pay | Admitting: Internal Medicine

## 2019-10-18 ENCOUNTER — Encounter: Payer: Self-pay | Admitting: Internal Medicine

## 2019-11-02 ENCOUNTER — Other Ambulatory Visit: Payer: Self-pay

## 2019-11-02 ENCOUNTER — Ambulatory Visit
Admission: RE | Admit: 2019-11-02 | Discharge: 2019-11-02 | Disposition: A | Discharge status: Home / Self Care | Payer: Self-pay | Source: Ambulatory Visit | Attending: Internal Medicine | Admitting: Internal Medicine

## 2019-11-02 DIAGNOSIS — E785 Hyperlipidemia, unspecified: Secondary | ICD-10-CM

## 2019-11-15 ENCOUNTER — Ambulatory Visit: Payer: BC Managed Care – PPO | Admitting: Cardiology

## 2019-11-18 NOTE — Progress Notes (Signed)
Primary Physician/Referring:  Cassandria Anger, MD  Patient ID: Katie Mclaughlin, female    DOB: 03/04/1971, 49 y.o.   MRN: [id number]  Chief Complaint  Patient presents with  . New Patient (Initial Visit)    Abn Coronary Calcium Test, Fam hx CAD  . Edema  . Breathing Problem  . Palpitations   HPI:    Katie Mclaughlin  is a 49 y.o. female patient with longstanding hyperlipidemia but otherwise no history of hypertension, diabetes, who is a non-smoker referred to me for evaluation of abnormal coronary calcification, dyspnea on exertion, palpitations.  Patient states that 2 years ago when she was trying to exercise, she was swimming and had severe chest burning sensation associated with marked dyspnea and every time she tried to exert she had similar experience hence has discontinued this.  No she only walks on a flat surface and denies any further chest discomfort although states that occasionally with palpitations she has been having some chest tightness.  Her main complaint is dyspnea on exertion even with minimal activities.  No PND or orthopnea.  She has noticed occasional leg edema in the evening.  She also complains of palpitations, described as rapid palpitations that come with or without exertional activities, she also feels chest discomfort during these episodes.  No recent weight changes, no dizziness or syncope.  Past Medical History:  Diagnosis Date  . BRONCHITIS, ACUTE 07/14/2010   Past Surgical History:  Procedure Laterality Date  . CESAREAN SECTION    . HYSTEROSCOPY WITH D & C N/A 08/14/2018   Procedure: DILATATION AND CURETTAGE /HYSTEROSCOPY;  Surgeon: Eldred Manges, MD;  Location: [facility];  Service: Gynecology;  Laterality: N/A;   Family History  Problem Relation Age of Onset  . Diabetes Mother   . Migraines Mother   . Hypertension Mother   . Hyperlipidemia Mother   . Diabetes Father   . Hypertension Father   . Hyperlipidemia Father   . Hypertension  Brother     Social History   Tobacco Use  . Smoking status: Never Smoker  . Smokeless tobacco: Never Used  Substance Use Topics  . Alcohol use: No   ROS  Review of Systems  Cardiovascular: Positive for chest pain, dyspnea on exertion and palpitations. Negative for leg swelling.  Gastrointestinal: Negative for melena.   Objective  Blood pressure 140/84, pulse 94, temperature 98.2 F (36.8 C), height 5\' 3"  (1.6 m), weight 192 lb 11.2 oz (87.4 kg), last menstrual period 10/30/2019, SpO2 98 %.  Vitals with BMI 11/19/2019 11/19/2019 06/07/2019  Height - 5\' 3"  5\' 3"   Weight - 192 lbs 11 oz 194 lbs  BMI - [id number]  Systolic [id number]  Diastolic 84 93 78  Pulse - 94 97     Physical Exam  Constitutional:  Moderately obese  Cardiovascular: Normal rate, regular rhythm, normal heart sounds and intact distal pulses. Exam reveals no gallop.  No murmur heard. No leg edema, no JVD.  Pulmonary/Chest: Effort normal and breath sounds normal.  Abdominal: Soft. Bowel sounds are normal.   Laboratory examination:   Recent Labs    06/07/19 1512  NA 137  K 3.7  CL 101  CO2 29  GLUCOSE 81  BUN 12  CREATININE 0.60  CALCIUM 9.5   CrCl cannot be calculated (Patient's most recent lab result is older than the maximum 21 days allowed.).  CMP Latest Ref Rng & Units 06/07/2019 08/14/2018 10/04/2016  Glucose 70 - 99 mg/dL 81 - 100(H)  BUN 6 - 23 mg/dL 12 - 10  Creatinine 0.40 - 1.20 mg/dL 0.60 - 0.62  Sodium 135 - 145 mEq/L 137 140 137  Potassium 3.5 - 5.1 mEq/L 3.7 3.9 3.9  Chloride 96 - 112 mEq/L 101 - 103  CO2 19 - 32 mEq/L 29 - 27  Calcium 8.4 - 10.5 mg/dL 9.5 - 9.2  Total Protein 6.0 - 8.3 g/dL 7.1 - 7.3  Total Bilirubin 0.2 - 1.2 mg/dL 0.4 - 0.4  Alkaline Phos 39 - 117 U/L 64 - 59  AST 0 - 37 U/L 16 - 14  ALT 0 - 35 U/L 19 - 16   CBC Latest Ref Rng & Units 06/07/2019 08/15/2018 08/14/2018  WBC 4.0 - 10.5 K/uL 8.2 14.7(H) 12.6(H)  Hemoglobin 12.0 - 15.0 g/dL 13.6 8.6(L) 8.0(L)    Hematocrit 36.0 - 46.0 % 40.4 25.8(L) 23.9(L)  Platelets 150.0 - 400.0 K/uL 219.0 190 173   Lipid Panel     Component Value Date/Time   CHOL 227 (H) 06/07/2019 1512   TRIG 193.0 (H) 06/07/2019 1512   HDL 33.30 (L) 06/07/2019 1512   CHOLHDL 7 06/07/2019 1512   VLDL 38.6 06/07/2019 1512   LDLCALC 155 (H) 06/07/2019 1512   LDLDIRECT 184.8 09/18/2012 1455   HEMOGLOBIN A1C No results found for: HGBA1C, MPG TSH Recent Labs    06/07/19 1512  TSH 0.89   Medications and allergies  No Known Allergies   Current Outpatient Medications  Medication Instructions  . Cholecalciferol (VITAMIN D3) 125 MCG (5000 UT) TABS Oral, Daily  . ferrous sulfate 325 mg, Oral, Daily  . norethindrone (MICRONOR) 0.35 MG tablet 1 tablet, Oral, Daily    Radiology:   CT cardiac calcium scoring 11/04/2019: Ascending Aorta: Normal Pericardium: Normal Coronary arteries: Calcium score 0.5 Agatston units. IMPRESSION: Coronary calcium score of 0.5 Agatston units. This was 89th percentile for age and sex matched control, suggesting high risk for future cardiac events. No significant extracardiac abnormality.   Cardiac Studies:   None  EKG 11/19/2019: Normal sinus rhythm with rate of 84 bpm, normal axis.  No evidence of ischemia, normal EKG.      Assessment     ICD-10-CM   1. Palpitations  R00.2 CARDIAC EVENT MONITOR  2. Dyspnea on exertion  R06.00 EKG 12-Lead    PCV ECHOCARDIOGRAM COMPLETE    CT CORONARY MORPH W/CTA COR W/SCORE W/CA W/CM &/OR WO/CM    CT CORONARY FRACTIONAL FLOW RESERVE DATA PREP    CT CORONARY FRACTIONAL FLOW RESERVE FLUID ANALYSIS  3. Atypical chest pain  R07.89 CT CORONARY MORPH W/CTA COR W/SCORE W/CA W/CM &/OR WO/CM    CT CORONARY FRACTIONAL FLOW RESERVE DATA PREP    CT CORONARY FRACTIONAL FLOW RESERVE FLUID ANALYSIS  4. Agatston CAC score, <100  R93.1 CT CORONARY MORPH W/CTA COR W/SCORE W/CA W/CM &/OR WO/CM  5. Mixed hyperlipidemia  E78.2 CT CORONARY MORPH W/CTA COR W/SCORE  W/CA W/CM &/OR WO/CM  6. Prehypertension  R03.0 CT CORONARY MORPH W/CTA COR W/SCORE W/CA W/CM &/OR WO/CM  7. Class 1 obesity due to excess calories without serious comorbidity with body mass index (BMI) of 34.0 to 34.9 in adult  E66.09    Z68.34     No orders of the defined types were placed in this encounter.   Medications Discontinued During This Encounter  Medication Reason  . Cholecalciferol (VITAMIN D3) 1.25 MG (50000 UT) CAPS Completed Course  . cephALEXin (KEFLEX) 500 MG capsule Completed Course  . Cholecalciferol (VITAMIN D3) 50  MCG (2000 UT) capsule Dose change  . loratadine (CLARITIN) 10 MG tablet No longer needed (for PRN medications)  . medroxyPROGESTERone (PROVERA) 10 MG tablet Change in therapy  . erythromycin ophthalmic ointment Completed Course     Recommendations:   Katie Mclaughlin  is a 49 y.o. female patient with longstanding hyperlipidemia but otherwise no history of hypertension, diabetes, who is a non-smoker referred to me for evaluation of abnormal coronary calcification, dyspnea on exertion, palpitations.  She is only 49 years of age and a very concerned about her symptoms of marked dyspnea even with minimal activities, associated palpitations and occasional chest pain.  She has prehypertension, initially the blood pressure was elevated at 140/92 mmHg which came down to 140/84 mmHg upon waking up.  She also has mixed hyperlipidemia with marked elevation in direct LDL.  She will need therapy for the same.  In view of her risk factors, I have recommended coronary CTA to further evaluate. Will schedule for an echocardiogram. I will set up for a Event/Holter monitor for 10 days. Explained how to use it and to activate the device.  Office visit following the work-up/investigations.   I have reviewed her coronary calcium score, she has very mild abnormality.  However most important aspect of this is need for aggressive risk factor modification.  Although she does not have  any other risk factors, she is a non-smoker, there is no history of hypertension or diabetes, in view of abnormal coronary calcification, would recommend statin therapy for mixed hyperlipidemia.  Goal LDL would be at least less than 100 if not closer to 70. I will address this once I get all the data.   Obesity discussed.  Adrian Prows, MD, [facility] 11/19/2019, 10:35 AM [facility]. [place] Office: [phone]

## 2019-11-19 ENCOUNTER — Encounter: Payer: Self-pay | Admitting: Cardiology

## 2019-11-19 ENCOUNTER — Other Ambulatory Visit: Payer: Self-pay

## 2019-11-19 ENCOUNTER — Ambulatory Visit: Payer: BC Managed Care – PPO | Admitting: Cardiology

## 2019-11-19 ENCOUNTER — Ambulatory Visit: Payer: BC Managed Care – PPO

## 2019-11-19 VITALS — BP 140/84 | HR 94 | Temp 98.2°F | Ht 63.0 in | Wt 192.7 lb

## 2019-11-19 DIAGNOSIS — R931 Abnormal findings on diagnostic imaging of heart and coronary circulation: Secondary | ICD-10-CM | POA: Diagnosis not present

## 2019-11-19 DIAGNOSIS — R002 Palpitations: Secondary | ICD-10-CM

## 2019-11-19 DIAGNOSIS — E6609 Other obesity due to excess calories: Secondary | ICD-10-CM

## 2019-11-19 DIAGNOSIS — R0600 Dyspnea, unspecified: Secondary | ICD-10-CM | POA: Diagnosis not present

## 2019-11-19 DIAGNOSIS — R0789 Other chest pain: Secondary | ICD-10-CM | POA: Diagnosis not present

## 2019-11-19 DIAGNOSIS — R0609 Other forms of dyspnea: Secondary | ICD-10-CM

## 2019-11-19 DIAGNOSIS — E782 Mixed hyperlipidemia: Secondary | ICD-10-CM

## 2019-11-19 DIAGNOSIS — R030 Elevated blood-pressure reading, without diagnosis of hypertension: Secondary | ICD-10-CM

## 2019-11-19 DIAGNOSIS — Z6834 Body mass index (BMI) 34.0-34.9, adult: Secondary | ICD-10-CM

## 2019-12-05 DIAGNOSIS — Z20828 Contact with and (suspected) exposure to other viral communicable diseases: Secondary | ICD-10-CM | POA: Diagnosis not present

## 2019-12-06 ENCOUNTER — Other Ambulatory Visit: Payer: Self-pay

## 2019-12-06 ENCOUNTER — Ambulatory Visit: Payer: BC Managed Care – PPO

## 2019-12-06 DIAGNOSIS — R0609 Other forms of dyspnea: Secondary | ICD-10-CM | POA: Diagnosis not present

## 2019-12-13 ENCOUNTER — Other Ambulatory Visit (HOSPITAL_COMMUNITY): Payer: Self-pay | Admitting: Emergency Medicine

## 2019-12-13 ENCOUNTER — Other Ambulatory Visit: Payer: Self-pay | Admitting: Cardiology

## 2019-12-13 DIAGNOSIS — R0789 Other chest pain: Secondary | ICD-10-CM

## 2019-12-13 DIAGNOSIS — R002 Palpitations: Secondary | ICD-10-CM

## 2019-12-13 MED ORDER — METOPROLOL TARTRATE 100 MG PO TABS: 100.0000 mg | ORAL_TABLET | Freq: Once | ORAL | 0 refills | Status: DC

## 2019-12-14 ENCOUNTER — Ambulatory Visit (HOSPITAL_COMMUNITY): Payer: BC Managed Care – PPO

## 2020-01-01 ENCOUNTER — Ambulatory Visit: Payer: BC Managed Care – PPO | Admitting: Cardiology

## 2020-01-01 NOTE — Progress Notes (Signed)
Primary Physician/Referring:  Cassandria Anger, MD  Patient ID: Katie Mclaughlin, female    DOB: 14-Jul-1971, 49 y.o.   MRN: [id number]  Chief Complaint  Patient presents with  . Palpitations  . Shortness of Breath  . Coronary CTA  . Follow-up    6 week   HPI:    Katie Mclaughlin  is a 49 y.o. female patient with longstanding hyperlipidemia, pre-hypertension,  coronary calcium score of 0.5 Agatston Units, dyspnea on exertion, palpitations. Underwent echo and Event montior and presents for f/u. Coronary CTA was ordered to evaluate her symptoms of atypical chest pain. Now presents for 6 week f/u.  She did not obtain coronary CTA and brings her husband and wanted to discuss indications for procedure.  Otherwise no changes in her symptoms, she still has occasional chest tightness with or without exertional activity and occasional palpitations and also dyspnea on exertion.  Past Medical History:  Diagnosis Date  . BRONCHITIS, ACUTE 07/14/2010   Past Surgical History:  Procedure Laterality Date  . CESAREAN SECTION    . HYSTEROSCOPY WITH D & C N/A 08/14/2018   Procedure: DILATATION AND CURETTAGE /HYSTEROSCOPY;  Surgeon: Eldred Manges, MD;  Location: [facility];  Service: Gynecology;  Laterality: N/A;   Family History  Problem Relation Age of Onset  . Diabetes Mother   . Migraines Mother   . Hypertension Mother   . Hyperlipidemia Mother   . Diabetes Father   . Hypertension Father   . Hyperlipidemia Father   . Hypertension Brother     Social History   Tobacco Use  . Smoking status: Never Smoker  . Smokeless tobacco: Never Used  Substance Use Topics  . Alcohol use: No   ROS  Review of Systems  Cardiovascular: Positive for chest pain, dyspnea on exertion and palpitations. Negative for leg swelling.  Gastrointestinal: Negative for melena.   Objective  Blood pressure 132/87, pulse 70, temperature 98.2 F (36.8 C), temperature source Temporal, resp. rate 16, height 5\' 3"   (1.6 m), weight 189 lb 4.8 oz (85.9 kg), SpO2 98 %.  Vitals with BMI 01/02/2020 11/19/2019 11/19/2019  Height 5\' 3"  - 5\' 3"   Weight 189 lbs 5 oz - 192 lbs 11 oz  BMI 123456 - Q000111Q  Systolic Q000111Q [id number]  Diastolic 87 84 93  Pulse 70 - 94     Physical Exam  Constitutional:  Moderately obese  Cardiovascular: Normal rate, regular rhythm, normal heart sounds and intact distal pulses. Exam reveals no gallop.  No murmur heard. No leg edema, no JVD.  Pulmonary/Chest: Effort normal and breath sounds normal.  Abdominal: Soft. Bowel sounds are normal.   Laboratory examination:   Recent Labs    06/07/19 1512  NA 137  K 3.7  CL 101  CO2 29  GLUCOSE 81  BUN 12  CREATININE 0.60  CALCIUM 9.5   CrCl cannot be calculated (Patient's most recent lab result is older than the maximum 21 days allowed.).  CMP Latest Ref Rng & Units 06/07/2019 08/14/2018 10/04/2016  Glucose 70 - 99 mg/dL 81 - 100(H)  BUN 6 - 23 mg/dL 12 - 10  Creatinine 0.40 - 1.20 mg/dL 0.60 - 0.62  Sodium 135 - 145 mEq/L 137 140 137  Potassium 3.5 - 5.1 mEq/L 3.7 3.9 3.9  Chloride 96 - 112 mEq/L 101 - 103  CO2 19 - 32 mEq/L 29 - 27  Calcium 8.4 - 10.5 mg/dL 9.5 - 9.2  Total Protein 6.0 - 8.3 g/dL 7.1 -  7.3  Total Bilirubin 0.2 - 1.2 mg/dL 0.4 - 0.4  Alkaline Phos 39 - 117 U/L 64 - 59  AST 0 - 37 U/L 16 - 14  ALT 0 - 35 U/L 19 - 16   CBC Latest Ref Rng & Units 06/07/2019 08/15/2018 08/14/2018  WBC 4.0 - 10.5 K/uL 8.2 14.7(H) 12.6(H)  Hemoglobin 12.0 - 15.0 g/dL 13.6 8.6(L) 8.0(L)  Hematocrit 36.0 - 46.0 % 40.4 25.8(L) 23.9(L)  Platelets 150.0 - 400.0 K/uL 219.0 190 173   Lipid Panel     Component Value Date/Time   CHOL 227 (H) 06/07/2019 1512   TRIG 193.0 (H) 06/07/2019 1512   HDL 33.30 (L) 06/07/2019 1512   CHOLHDL 7 06/07/2019 1512   VLDL 38.6 06/07/2019 1512   LDLCALC 155 (H) 06/07/2019 1512   LDLDIRECT 184.8 09/18/2012 1455   HEMOGLOBIN A1C No results found for: HGBA1C, MPG TSH Recent Labs    06/07/19 1512    TSH 0.89   Medications and allergies  No Known Allergies   Current Outpatient Medications  Medication Instructions  . Cholecalciferol (VITAMIN D3) 125 MCG (5000 UT) TABS Oral, Daily  . ferrous sulfate 325 mg, Oral, Daily  . metoprolol tartrate (LOPRESSOR) 100 mg, Oral,  Once, Take 2 hours prior to cardiac CT  . metoprolol tartrate (LOPRESSOR) 25 mg, Oral, 2 times daily  . norethindrone (MICRONOR) 0.35 MG tablet 1 tablet, Oral, Daily  . rosuvastatin (CRESTOR) 20 mg, Oral, Daily   Radiology:    Cardiac Studies:   CT cardiac calcium scoring 11/04/2019: Ascending Aorta: Normal Pericardium: Normal Coronary arteries: Calcium score 0.5 Agatston units. IMPRESSION: Coronary calcium score of 0.5 Agatston units. This was 89th percentile for age and sex matched control, suggesting high risk for future cardiac events. No significant extracardiac abnormality.   Echocardiogram 12/06/2019:  Normal LV systolic function with visual EF 60-65%. Left ventricle cavity  is normal in size. Normal global wall motion. Normal wall thickness.  Indeterminate diastolic filling pattern. No obvious regional wall motion  abnormalities. Calculated EF 66%.  Left atrial cavity is mildly dilated.  No significant valvular abnormalities.  No prior study for comparison.   Event Monitor for 14 days Start date 11/19/2019: Predominant rhythm is normal sinus rhythm.  4 manually transmitted events revealed normal sinus rhythm.  No atrial fibrillation, no heart block  EKG:  EKG 11/19/2019: Normal sinus rhythm with rate of 84 bpm, normal axis.  No evidence of ischemia, normal EKG.      Assessment     ICD-10-CM   1. Atypical chest pain  R07.89   2. Dyspnea on exertion  R06.00   3. Palpitations  R00.2 metoprolol tartrate (LOPRESSOR) 25 MG tablet  4. Mixed hyperlipidemia  E78.2 rosuvastatin (CRESTOR) 20 MG tablet  5. Essential hypertension  I10 metoprolol tartrate (LOPRESSOR) 25 MG tablet    Meds ordered this  encounter  Medications  . metoprolol tartrate (LOPRESSOR) 25 MG tablet    Sig: Take 1 tablet (25 mg total) by mouth 2 (two) times daily.    Dispense:  60 tablet    Refill:  2  . rosuvastatin (CRESTOR) 20 MG tablet    Sig: Take 1 tablet (20 mg total) by mouth daily.    Dispense:  30 tablet    Refill:  2    There are no discontinued medications.   Recommendations:   Katie Mclaughlin  is a 49 y.o. female patient with longstanding hyperlipidemia, pre-hypertension, now hypertensive,  coronary calcium score of 0.5 Agatston Units, dyspnea  on exertion, palpitations. Underwent echo and Event montior and presents for f/u. COronary CTA was ordered to evaluate her symptoms of atypical chest pain. Now presents for 6 week f/u.  She brings in her husband as she wanted to discuss whether or it was indication for CT angiogram.  I at length discussed with the husband and patient that with moderate to atypical chest pain, coronary calcification noted on CT scan, hyperlipidemia and hypertension being risk factors and as she is asymptomatic we should proceed with the instead of doing the cardiac nuclear stress test which would give more data.  After explanation they are willing to proceed.  In view of palpitations and hypertension, I started her on metoprolol tartrate 25 mg p.o. twice daily which will also help with bradycardia and better images on the CTA.  To monitor did not reveal significant arrhythmias.  Started her on rosuvastatin 20 mg daily for mixed hyperlipidemia.  Diet and exercise discussed with the patient.  I would like to see her back in 6 weeks.   Adrian Prows, MD, [facility] 01/02/2020, 10:30 AM [facility]. [place] Office: [phone]

## 2020-01-02 ENCOUNTER — Ambulatory Visit: Payer: BC Managed Care – PPO | Admitting: Cardiology

## 2020-01-02 ENCOUNTER — Encounter: Payer: Self-pay | Admitting: Cardiology

## 2020-01-02 ENCOUNTER — Other Ambulatory Visit: Payer: Self-pay

## 2020-01-02 VITALS — BP 132/87 | HR 70 | Temp 98.2°F | Resp 16 | Ht 63.0 in | Wt 189.3 lb

## 2020-01-02 DIAGNOSIS — E782 Mixed hyperlipidemia: Secondary | ICD-10-CM | POA: Diagnosis not present

## 2020-01-02 DIAGNOSIS — R0609 Other forms of dyspnea: Secondary | ICD-10-CM | POA: Diagnosis not present

## 2020-01-02 DIAGNOSIS — R002 Palpitations: Secondary | ICD-10-CM | POA: Diagnosis not present

## 2020-01-02 DIAGNOSIS — R0789 Other chest pain: Secondary | ICD-10-CM | POA: Diagnosis not present

## 2020-01-02 DIAGNOSIS — I10 Essential (primary) hypertension: Secondary | ICD-10-CM

## 2020-01-02 MED ORDER — ROSUVASTATIN CALCIUM 20 MG PO TABS: 20.0000 mg | ORAL_TABLET | Freq: Every day | ORAL | 2 refills | Status: DC

## 2020-01-02 MED ORDER — METOPROLOL TARTRATE 25 MG PO TABS: 25.0000 mg | ORAL_TABLET | Freq: Two times a day (BID) | ORAL | 2 refills | Status: DC

## 2020-01-04 ENCOUNTER — Telehealth: Payer: Self-pay

## 2020-01-10 NOTE — Telephone Encounter (Signed)
error 

## 2020-01-31 DIAGNOSIS — N939 Abnormal uterine and vaginal bleeding, unspecified: Secondary | ICD-10-CM | POA: Diagnosis not present

## 2020-01-31 DIAGNOSIS — D251 Intramural leiomyoma of uterus: Secondary | ICD-10-CM | POA: Diagnosis not present

## 2020-01-31 DIAGNOSIS — D250 Submucous leiomyoma of uterus: Secondary | ICD-10-CM | POA: Diagnosis not present

## 2020-02-13 ENCOUNTER — Ambulatory Visit: Payer: BC Managed Care – PPO | Admitting: Cardiology

## 2020-02-19 DIAGNOSIS — D250 Submucous leiomyoma of uterus: Secondary | ICD-10-CM | POA: Diagnosis not present

## 2020-03-17 ENCOUNTER — Other Ambulatory Visit: Payer: Self-pay | Admitting: Obstetrics and Gynecology

## 2020-03-17 DIAGNOSIS — D259 Leiomyoma of uterus, unspecified: Secondary | ICD-10-CM

## 2020-04-02 DIAGNOSIS — D259 Leiomyoma of uterus, unspecified: Secondary | ICD-10-CM | POA: Diagnosis not present

## 2020-04-02 DIAGNOSIS — N920 Excessive and frequent menstruation with regular cycle: Secondary | ICD-10-CM | POA: Diagnosis not present

## 2020-04-09 ENCOUNTER — Other Ambulatory Visit: Payer: Self-pay | Admitting: Obstetrics & Gynecology

## 2020-04-17 NOTE — Pre-Procedure Instructions (Signed)
[employer] [facility] [place], [employer] [place] [address] Phone: [phone] Fax: [phone]     Your procedure is scheduled on Monday, August 2 from 1:15 PM- 5:00 PM.  Report to [facility] Main Entrance "A" at 11:15 A.M., and check in at the Admitting office.  Call this number if you have problems the morning of surgery:  [phone]  Call [phone] if you have any questions prior to your surgery date Monday-Friday 8am-4pm.    Remember:  Do not eat after midnight the night before your surgery.  You may drink clear liquids until 10:15 AM the morning of your surgery.   Clear liquids allowed are: Water, Non-Citrus Juices (without pulp), Carbonated Beverages, Clear Tea, Black Coffee Only, and Gatorade.    Take these medicines the morning of surgery with A SIP OF WATER: norethindrone (MICRONOR)  rosuvastatin (CRESTOR)   IF NEEDED: diphenhydrAMINE (BENADRYL) Naphazoline HCl (CLEAR EYES OP) eye drops  As of today, STOP taking any Aspirin (unless otherwise instructed by your surgeon) Aleve, Naproxen, Ibuprofen, Motrin, Advil, Goody's, BC's, all herbal medications, fish oil, and all vitamins.        The Morning of Surgery:              Do not wear jewelry, make up, or nail polish.            Do not wear lotions, powders, perfumes/, or deodorant.            Do not shave 48 hours prior to surgery.              Do not bring valuables to the hospital.            [facility] is not responsible for any belongings or valuables.  Do NOT Smoke (Tobacco/Vaping) or drink Alcohol 24 hours prior to your procedure. If you use a CPAP at night, you may bring all equipment for your overnight stay.   Contacts, glasses, dentures or bridgework may not be worn into surgery.      For patients admitted to the hospital, discharge time will be determined by your treatment team.   Patients discharged the day of surgery will  not be allowed to drive home, and someone needs to stay with them for 24 hours.    Special instructions:   [facility]- Preparing For Surgery  Before surgery, you can play an important role. Because skin is not sterile, your skin needs to be as free of germs as possible. You can reduce the number of germs on your skin by washing with CHG (chlorahexidine gluconate) Soap before surgery.  CHG is an antiseptic cleaner which kills germs and bonds with the skin to continue killing germs even after washing.    Oral Hygiene is also important to reduce your risk of infection.  Remember - BRUSH YOUR TEETH THE MORNING OF SURGERY WITH YOUR REGULAR TOOTHPASTE  Please do not use if you have an allergy to CHG or antibacterial soaps. If your skin becomes reddened/irritated stop using the CHG.  Do not shave (including legs and underarms) for at least 48 hours prior to first CHG shower. It is OK to shave your face.  Please follow these instructions carefully.   1. Shower the NIGHT BEFORE SURGERY and the MORNING OF SURGERY with CHG Soap.   2. If you chose to wash your hair, wash your hair first as usual with your  normal shampoo.  3. After you shampoo, rinse your hair and body thoroughly to remove the shampoo.  4. Use CHG as you would any other liquid soap. You can apply CHG directly to the skin and wash gently with a scrungie or a clean washcloth.   5. Apply the CHG Soap to your body ONLY FROM THE NECK DOWN.  Do not use on open wounds or open sores. Avoid contact with your eyes, ears, mouth and genitals (private parts). Wash Face and genitals (private parts)  with your normal soap.   6. Wash thoroughly, paying special attention to the area where your surgery will be performed.  7. Thoroughly rinse your body with warm water from the neck down.  8. DO NOT shower/wash with your normal soap after using and rinsing off the CHG Soap.  9. Pat yourself dry with a CLEAN TOWEL.  10. Wear CLEAN PAJAMAS to bed  the night before surgery  11. Place CLEAN SHEETS on your bed the night of your first shower and DO NOT SLEEP WITH PETS.   Day of Surgery: Wear Clean/Comfortable clothing the morning of surgery. Do not apply any deodorants/lotions.   Remember to brush your teeth WITH YOUR REGULAR TOOTHPASTE.   Please read over the following fact sheets that you were given.

## 2020-04-17 NOTE — Progress Notes (Signed)
[employer] Dr. Alesia Richards for procedure orders.

## 2020-04-18 ENCOUNTER — Other Ambulatory Visit (HOSPITAL_COMMUNITY)
Admission: RE | Admit: 2020-04-18 | Discharge: 2020-04-18 | Disposition: A | Discharge status: Home / Self Care | Payer: BC Managed Care – PPO | Source: Ambulatory Visit | Attending: Obstetrics & Gynecology | Admitting: Obstetrics & Gynecology

## 2020-04-18 ENCOUNTER — Telehealth: Payer: Self-pay

## 2020-04-18 ENCOUNTER — Encounter (HOSPITAL_COMMUNITY): Payer: Self-pay

## 2020-04-18 ENCOUNTER — Other Ambulatory Visit: Payer: Self-pay

## 2020-04-18 ENCOUNTER — Encounter (HOSPITAL_COMMUNITY)
Admission: RE | Admit: 2020-04-18 | Discharge: 2020-04-18 | Disposition: A | Discharge status: Home / Self Care | Payer: BC Managed Care – PPO | Source: Ambulatory Visit | Attending: Obstetrics & Gynecology | Admitting: Obstetrics & Gynecology

## 2020-04-18 DIAGNOSIS — D252 Subserosal leiomyoma of uterus: Secondary | ICD-10-CM | POA: Diagnosis not present

## 2020-04-18 DIAGNOSIS — T884XXA Failed or difficult intubation, initial encounter: Secondary | ICD-10-CM | POA: Diagnosis not present

## 2020-04-18 DIAGNOSIS — Z791 Long term (current) use of non-steroidal anti-inflammatories (NSAID): Secondary | ICD-10-CM | POA: Insufficient documentation

## 2020-04-18 DIAGNOSIS — N840 Polyp of corpus uteri: Secondary | ICD-10-CM | POA: Diagnosis not present

## 2020-04-18 DIAGNOSIS — N920 Excessive and frequent menstruation with regular cycle: Secondary | ICD-10-CM | POA: Diagnosis not present

## 2020-04-18 DIAGNOSIS — D649 Anemia, unspecified: Secondary | ICD-10-CM | POA: Diagnosis not present

## 2020-04-18 DIAGNOSIS — N9971 Accidental puncture and laceration of a genitourinary system organ or structure during a genitourinary system procedure: Secondary | ICD-10-CM | POA: Diagnosis not present

## 2020-04-18 DIAGNOSIS — D251 Intramural leiomyoma of uterus: Secondary | ICD-10-CM | POA: Diagnosis not present

## 2020-04-18 DIAGNOSIS — Z6835 Body mass index (BMI) 35.0-35.9, adult: Secondary | ICD-10-CM | POA: Insufficient documentation

## 2020-04-18 DIAGNOSIS — E0590 Thyrotoxicosis, unspecified without thyrotoxic crisis or storm: Secondary | ICD-10-CM | POA: Diagnosis not present

## 2020-04-18 DIAGNOSIS — Z20822 Contact with and (suspected) exposure to covid-19: Secondary | ICD-10-CM | POA: Diagnosis not present

## 2020-04-18 DIAGNOSIS — G43909 Migraine, unspecified, not intractable, without status migrainosus: Secondary | ICD-10-CM | POA: Diagnosis not present

## 2020-04-18 DIAGNOSIS — N838 Other noninflammatory disorders of ovary, fallopian tube and broad ligament: Secondary | ICD-10-CM | POA: Diagnosis not present

## 2020-04-18 DIAGNOSIS — J209 Acute bronchitis, unspecified: Secondary | ICD-10-CM | POA: Diagnosis not present

## 2020-04-18 DIAGNOSIS — E669 Obesity, unspecified: Secondary | ICD-10-CM | POA: Insufficient documentation

## 2020-04-18 DIAGNOSIS — Z1502 Genetic susceptibility to malignant neoplasm of ovary: Secondary | ICD-10-CM | POA: Diagnosis not present

## 2020-04-18 DIAGNOSIS — Z01812 Encounter for preprocedural laboratory examination: Secondary | ICD-10-CM | POA: Insufficient documentation

## 2020-04-18 DIAGNOSIS — R102 Pelvic and perineal pain: Secondary | ICD-10-CM | POA: Insufficient documentation

## 2020-04-18 DIAGNOSIS — Y836 Removal of other organ (partial) (total) as the cause of abnormal reaction of the patient, or of later complication, without mention of misadventure at the time of the procedure: Secondary | ICD-10-CM | POA: Diagnosis not present

## 2020-04-18 DIAGNOSIS — D259 Leiomyoma of uterus, unspecified: Secondary | ICD-10-CM | POA: Diagnosis not present

## 2020-04-18 DIAGNOSIS — E039 Hypothyroidism, unspecified: Secondary | ICD-10-CM | POA: Diagnosis not present

## 2020-04-18 DIAGNOSIS — Z79899 Other long term (current) drug therapy: Secondary | ICD-10-CM | POA: Diagnosis not present

## 2020-04-18 HISTORY — DX: Anemia, unspecified: D64.9

## 2020-04-18 HISTORY — DX: Hypothyroidism, unspecified: E03.9

## 2020-04-18 HISTORY — DX: Dyspnea, unspecified: R06.00

## 2020-04-18 LAB — CBC
HCT: 40.7 % (ref 36.0–46.0)
Hemoglobin: 13 g/dL (ref 12.0–15.0)
MCH: 30.7 pg (ref 26.0–34.0)
MCHC: 31.9 g/dL (ref 30.0–36.0)
MCV: 96 fL (ref 80.0–100.0)
Platelets: 228 10*3/uL (ref 150–400)
RBC: 4.24 MIL/uL (ref 3.87–5.11)
RDW: 13 % (ref 11.5–15.5)
WBC: 7.3 10*3/uL (ref 4.0–10.5)
nRBC: 0 % (ref 0.0–0.2)

## 2020-04-18 LAB — SARS CORONAVIRUS 2 (TAT 6-24 HRS): SARS Coronavirus 2: NEGATIVE

## 2020-04-18 NOTE — Progress Notes (Signed)
PCP - Evie Lacks. Plotnikov, MD Cardiologist - Adrian Prows, MD  PPM/ICD - Denies  Chest x-ray - N/A EKG - 11/19/19 Stress Test - Denies ECHO - 12/06/19 Cardiac Cath - Denies  Sleep Study - Denies   Patient denies being diabetic.  Blood Thinner Instructions: N/A Aspirin Instructions: N/A  ERAS Protcol - Yes PRE-SURGERY Ensure or G2- Not ordered  COVID TEST- 04/18/20 @ [place]   Anesthesia review: Yes, review ECHO.  Patient denies shortness of breath, fever, cough and chest pain at PAT appointment   All instructions explained to the patient, with a verbal understanding of the material. Patient agrees to go over the instructions while at home for a better understanding. Patient also instructed to self quarantine after being tested for COVID-19. The opportunity to ask questions was provided.

## 2020-04-18 NOTE — Telephone Encounter (Signed)
Sent Dr. Alesia Richards a direct message in regards to patient proceeding with surgery. Please send office pre op clearance asap. Thanks!

## 2020-04-18 NOTE — Telephone Encounter (Signed)
Left vm for patient informing the clearance of scheduled surgery.

## 2020-04-18 NOTE — H&P (Addendum)
Katie Mclaughlin is an 49 y.o. female Para 1 with a long history of heavy periods, polyps and fibroids.  She has tried various modalities for therapy including medication (birth control pills, megesterol), surgery (dilation and curretage hysteroscopy) without adequate relief of symptoms.  She now desires definitive therapy via laparoscopic assisted vaginal hysterectomy.  She also desires to decrease her future risk of ovarian and tubal cancer and she is for bilateral salpingectomy.  Cystoscopy will be done to check for bladder and ureters integrity.     Pertinent Gynecological History: Menses: flow is heavy Contraception: oral progesterone-only contraceptive DES exposure: unknown Blood transfusions: Once, 2019 Sexually transmitted diseases: no past history Previous GYN Procedures: D & C Hysteroscopy.   Last mammogram: normal Date: 06/29/2019 Last pap: normal Date: 08/2017 OB History: G1 P1001.    Menstrual History: Patient's last menstrual period was 03/25/2020.    Past Medical History:  Diagnosis Date  . Anemia   . BRONCHITIS, ACUTE 07/14/2010  . Dyspnea   . Hypothyroidism     Past Surgical History:  Procedure Laterality Date  . CESAREAN SECTION    . FRACTURE SURGERY     nose fracture sx. at 49 years old  . HYSTEROSCOPY WITH D & C N/A 08/14/2018   Procedure: DILATATION AND CURETTAGE /HYSTEROSCOPY;  Surgeon: Eldred Manges, MD;  Location: [facility];  Service: Gynecology;  Laterality: N/A;  . TONSILLECTOMY      Family History  Problem Relation Age of Onset  . Diabetes Mother   . Migraines Mother   . Hypertension Mother   . Hyperlipidemia Mother   . Diabetes Father   . Hypertension Father   . Hyperlipidemia Father   . Hypertension Brother     Social History:  reports that she has never smoked. She has never used smokeless tobacco. She reports that she does not drink alcohol and does not use drugs.  Allergies: No Known Allergies  No medications prior to admission.  No  current facility-administered medications for this encounter.  Current Outpatient Medications:  .  Cholecalciferol (VITAMIN D3) 125 MCG (5000 UT) TABS, Take 5,000 Units by mouth daily. , Disp: , Rfl:  .  diphenhydrAMINE (BENADRYL) 25 MG tablet, Take 25 mg by mouth daily as needed for allergies., Disp: , Rfl:  .  ferrous sulfate 325 (65 FE) MG tablet, Take 650 mg by mouth daily. , Disp: , Rfl:  .  ibuprofen (ADVIL) 200 MG tablet, Take 400 mg by mouth every 6 (six) hours as needed for headache or moderate pain., Disp: , Rfl:  .  Naphazoline HCl (CLEAR EYES OP), Place 1 drop into both eyes daily as needed (dry eyes)., Disp: , Rfl:  .  norethindrone (MICRONOR) 0.35 MG tablet, Take 1 tablet by mouth daily., Disp: , Rfl:  .  rosuvastatin (CRESTOR) 20 MG tablet, Take 1 tablet (20 mg total) by mouth daily., Disp: 30 tablet, Rfl: 2  Review of Systems Constitutional: Denies fevers/chills Cardiovascular: Denies chest pain or palpitations Pulmonary: Denies coughing or wheezing Gastrointestinal: Denies nausea, vomiting or diarrhea Genitourinary: Denies pelvic pain, unusual vaginal discharge, dysuria, urgency or frequency. With unusual vaginal bleeding, heavy periods.  Musculoskeletal: Denies muscle or joint aches and pain.  Neurology: Denies abnormal sensations such as tingling or numbness.    Last menstrual period 03/25/2020. Physical Exam   Blood pressure 128/79, pulse 86, temperature 98.2 F (36.8 C), temperature source Oral, resp. rate 18, height 5\' 4"  (1.626 m), weight 86.2 kg, last menstrual period 03/25/2020, SpO2 100 %.  Vitals reviewed. Constitutional: She is oriented to person, place, and time. She appears well-developed and well-nourished.  HENT:  Head: Normocephalic and atraumatic.  Eyes: Conjunctivae and EOM are normal.  Neck: Normal range of motion. Neck supple.  Cardiovascular: Normal rate, regular rhythm, normal heart sounds and intact distal pulses.  Respiratory: Effort normal and  breath sounds normal.  GI: Soft. She exhibits no mass. There is no tenderness.  Genitourinary: Vagina normal and uterus normal.  Musculoskeletal: Normal range of motion.  Neurological: She is alert and oriented to person, place, and time.  Skin: Skin is warm and dry.  Psychiatric: She has a normal mood and affect. Judgment normal.   Results for orders placed or performed during the hospital encounter of 04/18/20 (from the past 24 hour(s))  CBC per protocol     Status: None   Collection Time: 04/18/20 10:49 AM  Result Value Ref Range   WBC 7.3 4.0 - 10.5 K/uL   RBC 4.24 3.87 - 5.11 MIL/uL   Hemoglobin 13.0 12.0 - 15.0 g/dL   HCT 40.7 36 - 46 %   MCV 96.0 80.0 - 100.0 fL   MCH 30.7 26.0 - 34.0 pg   MCHC 31.9 30.0 - 36.0 g/dL   RDW 13.0 11.5 - 15.5 %   Platelets 228 150 - 400 K/uL   nRBC 0.0 0.0 - 0.2 %   CMP     Component Value Date/Time   NA 137 06/07/2019 1512   K 3.7 06/07/2019 1512   CL 101 06/07/2019 1512   CO2 29 06/07/2019 1512   GLUCOSE 81 06/07/2019 1512   BUN 12 06/07/2019 1512   CREATININE 0.60 06/07/2019 1512   CALCIUM 9.5 06/07/2019 1512   PROT 7.1 06/07/2019 1512   ALBUMIN 4.3 06/07/2019 1512   AST 16 06/07/2019 1512   ALT 19 06/07/2019 1512   ALKPHOS 64 06/07/2019 1512   BILITOT 0.4 06/07/2019 1512   Pelvic Ultrasound 07/05/2019: Uterus 10.9 x 6.8 x 6.4 cm, 173 cc.Normal left and right ovary.  Posterior intramural fibroid 2.3 cm x 2.3 cmx 2.4 cm with submucosal component.  Anterior fibroid 1.4 x 1.4 cm.   Saline sonohysterogram: 03/13/2020: Posterior lower uterine segment fibroid with submucosal component.   D &C Hysteroscopy pathology 08/14/2018: Endometrial polyp, leiomyoma.   Endometrial biopsy: Done at [facility] recently per patient was normal, unable to obtain those records.    COVID TESTING:    Ref Range & Units 3 d ago  SARS Coronavirus 2 NEGATIVE NEGATIVE   Comment: (NOTE)  SARS-CoV-2 target nucleic acids are NOT DETECTED.   The  SARS-CoV-2 RNA is generally detectable in upper and lower  respiratory specimens during the acute phase of infection. Negative  results do not preclude SARS-CoV-2 infection, do not rule out  co-infections with other pathogens, and should not be used as the  sole basis for treatment or other patient management decisions.  Negative results must be combined with clinical observations,  patient history, and epidemiological information. The expected  result is Negative.   Fact Sheet for Patients:  SugarRoll.be   Fact Sheet for Healthcare Providers:  https://www.woods-mathews.com/   This test is not yet approved or cleared by the [place] FDA and  has been authorized for detection and/or diagnosis of SARS-CoV-2 by  FDA under an Emergency Use Authorization (EUA). This EUA will remain  in effect (meaning this test can be used) for the duration of the  COVID-19 declaration under Section 564(b)(1) of the Act, 21 U.S.C.  section 360bbb-3(b)(1), unless the authorization is terminated or  revoked sooner.   Performed at [facility], [place] [address] [place], [place]  [address]    Urine pregnancy test: 04/21/2020: Negative.    Blood type and screen: 0 Result Notes Component 11:55  ABO/RH(D) AB POS   Antibody Screen NEG   Sample Expiration 04/24/2020,2359        Assessment/Plan: 49 y/o P1 with menorrhagia, fibroid uterus here for a laparoscopic assisted vaginal hysterectomy and bilateral salpingectomy and cystoscopy, - Admit to [facility].  - NPO and IV fluids - Pre-op orders as in chart.  This procedure has been fully reviewed with the patient and written informed consent has been obtained. Discussed with patient risks, benefits and alternatives of the procedure including but not limited to bleeding, infection, damage to organs and need for additional procedures.  We discussed possible need of conversion to  laparotomy.  All her questions were answered and she consented to the procedure.    Alinda Dooms, MD.  04/18/2020, 11:46 AM

## 2020-04-18 NOTE — Anesthesia Preprocedure Evaluation (Addendum)
Anesthesia Evaluation  Patient identified by MRN, date of birth, ID band Patient awake    Reviewed: Allergy & Precautions, NPO status , Patient's Chart, lab work & pertinent test results  History of Anesthesia Complications Negative for: history of anesthetic complications  Airway Mallampati: II  TM Distance: >3 FB Neck ROM: Full    Dental  (+) Teeth Intact   Pulmonary neg pulmonary ROS,    Pulmonary exam normal        Cardiovascular Normal cardiovascular exam  Chest pain evaluated by Dr. Einar Gip, felt to be atypical   Neuro/Psych negative neurological ROS  negative psych ROS   GI/Hepatic negative GI ROS, Neg liver ROS,   Endo/Other  Hypothyroidism   Renal/GU negative Renal ROS  negative genitourinary   Musculoskeletal negative musculoskeletal ROS (+)   Abdominal   Peds  Hematology negative hematology ROS (+)   Anesthesia Other Findings   Reproductive/Obstetrics                             Anesthesia Physical Anesthesia Plan  ASA: II  Anesthesia Plan: General   Post-op Pain Management:    Induction: Intravenous  PONV Risk Score and Plan: 3 and Ondansetron, Dexamethasone, Treatment may vary due to age or medical condition and Midazolam  Airway Management Planned: Oral ETT  Additional Equipment: None  Intra-op Plan:   Post-operative Plan: Extubation in OR  Informed Consent: I have reviewed the patients History and Physical, chart, labs and discussed the procedure including the risks, benefits and alternatives for the proposed anesthesia with the patient or authorized representative who has indicated his/her understanding and acceptance.     Dental advisory given  Plan Discussed with:   Anesthesia Plan Comments: (See PAT note written 04/18/2020 by Myra Gianotti, PA-C. )       Anesthesia Quick Evaluation

## 2020-04-18 NOTE — Progress Notes (Signed)
Anesthesia Chart Review:  Case: [id number] Date/Time: 04/21/20 1300   Procedures:      LAPAROSCOPIC ASSISTED VAGINAL HYSTERECTOMY WITH SALPINGECTOMY (Bilateral )     LAPAROTOMY (N/A )   Anesthesia type: Choice   Pre-op diagnosis: PELVIC PAIN   Location: [facility] OR ROOM 07 / [facility]   Surgeons: Waymon Amato, MD      DISCUSSION: Patient is a 49 year old female scheduled for the above procedure.   History includes never smoker, dyspnea, anemia, hypothyroidism. BMI is consistent with obesity.   She was evaluated by cardiologist Dr. Einar Gip on 11/19/19 for DOE, palpitations that can be associated with chest discomfort. It sounds like he felt chest pain was atypical. Coronary calcium score of 0.5 Agatston units which was in the 89th percentile for age and sex matched control, suggesting high risk for future cardiac events. Her event monitor and echo were overall unremarkable. He saw her back on 01/02/20 and discussed with her and her husband further testing with decision to proceed with coronary CTA, as he felt it would give more data than a nuclear stress test.  He planned to see her in 6 weeks; however, she has not been seen since then and has not yet had the coronary CTA.  She denied chest pain at her PAT RN visit.    Although, cardiology notes suggest that Dr. Einar Gip felt chest symptoms were atypical, he did order a coronary CTA which has not be done yet. She is scheduled for hysterectomy with BSO for pelvic pain on 04/21/20. I notified surgery scheduler Stacy with Dr. Alesia Richards that Dr. Einar Gip will need to provide preoperative input for surgery. She will reach out to his office.    04/18/20 preoperative COVID-19 test is in process.   UPDATE 04/18/20 5:56 PM:  No surgical clearance note received via PAT fax at this time. There is a notation from [place], [place] from 16:50 today stating, "Sent Dr. Alesia Richards a direct message in regards to patient proceeding with surgery. Please send office pre op clearance asap."  followed by 17:06 message, "Left vm for patient informing the clearance of scheduled surgery."   VS: BP (!) 132/88   Pulse 74   Temp 37 C (Oral)   Resp 18   Ht 5\' 1"  (1.549 m)   Wt 86.2 kg   LMP 03/25/2020   SpO2 98%   BMI 35.91 kg/m   PROVIDERS: Plotnikov, Evie Lacks, MD is PCP  Adrian Prows, MD is cardiologist   LABS: Labs reviewed: Acceptable for surgery. (all labs ordered are listed, but only abnormal results are displayed)  Labs Reviewed  CBC    EKG: EKG 11/19/2019: Normal sinus rhythm with rate of 84 bpm, normal axis.  No evidence of ischemia, normal EKG.     CV: Echocardiogram 12/06/2019:  Normal LV systolic function with visual EF 60-65%. Left ventricle cavity  is normal in size. Normal global wall motion. Normal wall thickness.  Indeterminate diastolic filling pattern. No obvious regional wall motion  abnormalities. Calculated EF 66%.  Left atrial cavity is mildly dilated.  No significant valvular abnormalities.  No prior study for comparison.  Event Monitor for 14 days Start date 11/19/2019: Predominant rhythm is normal sinus rhythm.  4 manually transmitted events revealed normal sinus rhythm.  No atrial fibrillation, no heart block.  CT Cardiac Scoring 11/02/2019: FINDINGS: Non-cardiac: See separate report from [facility]. Ascending Aorta: Normal Pericardium: Normal Coronary arteries: Calcium score 0.5 Agatston units. IMPRESSION: Coronary calcium score of 0.5 Agatston units.  This was 89th percentile for age and sex matched control, suggesting high risk for future cardiac events.   Past Medical History:  Diagnosis Date  . Anemia   . BRONCHITIS, ACUTE 07/14/2010  . Dyspnea   . Hypothyroidism     Past Surgical History:  Procedure Laterality Date  . CESAREAN SECTION    . FRACTURE SURGERY     nose fracture sx. at 49 years old  . HYSTEROSCOPY WITH D & C N/A 08/14/2018   Procedure: DILATATION AND CURETTAGE /HYSTEROSCOPY;  Surgeon:  Eldred Manges, MD;  Location: [facility];  Service: Gynecology;  Laterality: N/A;  . TONSILLECTOMY      MEDICATIONS: . Cholecalciferol (VITAMIN D3) 125 MCG (5000 UT) TABS  . diphenhydrAMINE (BENADRYL) 25 MG tablet  . ferrous sulfate 325 (65 FE) MG tablet  . ibuprofen (ADVIL) 200 MG tablet  . Naphazoline HCl (CLEAR EYES OP)  . norethindrone (MICRONOR) 0.35 MG tablet  . rosuvastatin (CRESTOR) 20 MG tablet   No current facility-administered medications for this encounter.    Myra Gianotti, PA-C Surgical Short Stay/Anesthesiology [facility] Phone [phone] [facility] Phone [phone] 04/18/2020 11:41 AM

## 2020-04-19 ENCOUNTER — Encounter: Payer: Self-pay | Admitting: Cardiology

## 2020-04-21 ENCOUNTER — Ambulatory Visit (HOSPITAL_COMMUNITY): Payer: BC Managed Care – PPO | Admitting: Vascular Surgery

## 2020-04-21 ENCOUNTER — Inpatient Hospital Stay (HOSPITAL_COMMUNITY)
Admission: AD | Admit: 2020-04-21 | Discharge: 2020-04-23 | DRG: 742 | Disposition: A | Discharge status: Home / Self Care | Payer: BC Managed Care – PPO | Attending: Obstetrics & Gynecology | Admitting: Obstetrics & Gynecology

## 2020-04-21 ENCOUNTER — Other Ambulatory Visit: Payer: Self-pay

## 2020-04-21 ENCOUNTER — Encounter (HOSPITAL_COMMUNITY): Payer: Self-pay | Admitting: Obstetrics & Gynecology

## 2020-04-21 ENCOUNTER — Encounter (HOSPITAL_COMMUNITY)
Admission: AD | Disposition: A | Discharge status: Home / Self Care | Payer: Self-pay | Source: Home / Self Care | Attending: Obstetrics & Gynecology

## 2020-04-21 ENCOUNTER — Ambulatory Visit (HOSPITAL_COMMUNITY): Payer: BC Managed Care – PPO | Admitting: Certified Registered Nurse Anesthetist

## 2020-04-21 DIAGNOSIS — E039 Hypothyroidism, unspecified: Secondary | ICD-10-CM | POA: Diagnosis present

## 2020-04-21 DIAGNOSIS — D649 Anemia, unspecified: Secondary | ICD-10-CM | POA: Diagnosis present

## 2020-04-21 DIAGNOSIS — Y836 Removal of other organ (partial) (total) as the cause of abnormal reaction of the patient, or of later complication, without mention of misadventure at the time of the procedure: Secondary | ICD-10-CM | POA: Diagnosis not present

## 2020-04-21 DIAGNOSIS — Z79899 Other long term (current) drug therapy: Secondary | ICD-10-CM

## 2020-04-21 DIAGNOSIS — J209 Acute bronchitis, unspecified: Secondary | ICD-10-CM | POA: Diagnosis not present

## 2020-04-21 DIAGNOSIS — D251 Intramural leiomyoma of uterus: Principal | ICD-10-CM | POA: Diagnosis present

## 2020-04-21 DIAGNOSIS — N9971 Accidental puncture and laceration of a genitourinary system organ or structure during a genitourinary system procedure: Secondary | ICD-10-CM | POA: Diagnosis not present

## 2020-04-21 DIAGNOSIS — N920 Excessive and frequent menstruation with regular cycle: Secondary | ICD-10-CM | POA: Diagnosis present

## 2020-04-21 DIAGNOSIS — E0590 Thyrotoxicosis, unspecified without thyrotoxic crisis or storm: Secondary | ICD-10-CM | POA: Diagnosis not present

## 2020-04-21 DIAGNOSIS — Z20822 Contact with and (suspected) exposure to covid-19: Secondary | ICD-10-CM | POA: Diagnosis present

## 2020-04-21 DIAGNOSIS — G43909 Migraine, unspecified, not intractable, without status migrainosus: Secondary | ICD-10-CM | POA: Diagnosis not present

## 2020-04-21 DIAGNOSIS — D259 Leiomyoma of uterus, unspecified: Secondary | ICD-10-CM | POA: Diagnosis not present

## 2020-04-21 HISTORY — PX: LAPAROTOMY: SHX154

## 2020-04-21 HISTORY — PX: LAPAROSCOPIC VAGINAL HYSTERECTOMY WITH SALPINGECTOMY: SHX6680

## 2020-04-21 HISTORY — PX: CYSTOSCOPY: SHX5120

## 2020-04-21 LAB — CBC
HCT: 35.5 % — ABNORMAL LOW (ref 36.0–46.0)
Hemoglobin: 11.5 g/dL — ABNORMAL LOW (ref 12.0–15.0)
MCH: 30.7 pg (ref 26.0–34.0)
MCHC: 32.4 g/dL (ref 30.0–36.0)
MCV: 94.7 fL (ref 80.0–100.0)
Platelets: 224 10*3/uL (ref 150–400)
RBC: 3.75 MIL/uL — ABNORMAL LOW (ref 3.87–5.11)
RDW: 12.9 % (ref 11.5–15.5)
WBC: 18.6 10*3/uL — ABNORMAL HIGH (ref 4.0–10.5)
nRBC: 0 % (ref 0.0–0.2)

## 2020-04-21 LAB — TYPE AND SCREEN
ABO/RH(D): AB POS
Antibody Screen: NEGATIVE

## 2020-04-21 LAB — POCT PREGNANCY, URINE: Preg Test, Ur: NEGATIVE

## 2020-04-21 SURGERY — HYSTERECTOMY, VAGINAL, LAPAROSCOPY-ASSISTED, WITH SALPINGECTOMY
Anesthesia: General | Site: Uterus

## 2020-04-21 MED ORDER — KETOROLAC TROMETHAMINE 30 MG/ML IJ SOLN
30.0000 mg | Freq: Four times a day (QID) | INTRAMUSCULAR | Status: AC
  Administered 2020-04-22 (×4): 30 mg via INTRAVENOUS
  Filled 2020-04-21 (×4): qty 1

## 2020-04-21 MED ORDER — HYDROMORPHONE 1 MG/ML IV SOLN
INTRAVENOUS | Status: DC
  Administered 2020-04-21: 30 mg via INTRAVENOUS
  Administered 2020-04-22: 1.5 mg via INTRAVENOUS
  Administered 2020-04-22: 0.9 mg via INTRAVENOUS
  Administered 2020-04-22: 3.2 mg via INTRAVENOUS

## 2020-04-21 MED ORDER — NALOXONE HCL 0.4 MG/ML IJ SOLN: 0.4000 mg | INTRAMUSCULAR | Status: DC | PRN

## 2020-04-21 MED ORDER — HEMOSTATIC AGENTS (NO CHARGE) OPTIME
TOPICAL | Status: DC | PRN
  Administered 2020-04-21: 1 via TOPICAL

## 2020-04-21 MED ORDER — ACETAMINOPHEN 10 MG/ML IV SOLN
INTRAVENOUS | Status: DC | PRN
  Administered 2020-04-21: 1000 mg via INTRAVENOUS

## 2020-04-21 MED ORDER — LIDOCAINE-EPINEPHRINE 1 %-1:100000 IJ SOLN
INTRAMUSCULAR | Status: AC
  Filled 2020-04-21: qty 1

## 2020-04-21 MED ORDER — ONDANSETRON HCL 4 MG/2ML IJ SOLN: 4.0000 mg | Freq: Four times a day (QID) | INTRAMUSCULAR | Status: DC | PRN

## 2020-04-21 MED ORDER — LIDOCAINE 20MG/ML (2%) 15 ML SYRINGE OPTIME
INTRAMUSCULAR | Status: DC | PRN
  Administered 2020-04-21: 60 mg via INTRAVENOUS

## 2020-04-21 MED ORDER — SODIUM CHLORIDE 0.9% FLUSH: 9.0000 mL | INTRAVENOUS | Status: DC | PRN

## 2020-04-21 MED ORDER — FENTANYL CITRATE (PF) 250 MCG/5ML IJ SOLN
INTRAMUSCULAR | Status: AC
  Filled 2020-04-21: qty 5

## 2020-04-21 MED ORDER — ORAL CARE MOUTH RINSE
15.0000 mL | Freq: Once | OROMUCOSAL | Status: DC
Start: 2020-04-21 — End: 2020-04-21

## 2020-04-21 MED ORDER — ONDANSETRON HCL 4 MG PO TABS: 4.0000 mg | ORAL_TABLET | Freq: Four times a day (QID) | ORAL | Status: DC | PRN

## 2020-04-21 MED ORDER — SUGAMMADEX SODIUM 200 MG/2ML IV SOLN
INTRAVENOUS | Status: DC | PRN
  Administered 2020-04-21: 200 mg via INTRAVENOUS

## 2020-04-21 MED ORDER — ACETAMINOPHEN 10 MG/ML IV SOLN
INTRAVENOUS | Status: AC
  Filled 2020-04-21: qty 100

## 2020-04-21 MED ORDER — FENTANYL CITRATE (PF) 100 MCG/2ML IJ SOLN
INTRAMUSCULAR | Status: AC
  Filled 2020-04-21: qty 2

## 2020-04-21 MED ORDER — 0.9 % SODIUM CHLORIDE (POUR BTL) OPTIME
TOPICAL | Status: DC | PRN
  Administered 2020-04-21 (×2): 1000 mL

## 2020-04-21 MED ORDER — LACTATED RINGERS IV SOLN: INTRAVENOUS | Status: DC

## 2020-04-21 MED ORDER — IBUPROFEN 600 MG PO TABS
600.0000 mg | ORAL_TABLET | Freq: Four times a day (QID) | ORAL | Status: DC
  Administered 2020-04-23 (×2): 600 mg via ORAL
  Filled 2020-04-21 (×2): qty 1

## 2020-04-21 MED ORDER — ORAL CARE MOUTH RINSE: 15.0000 mL | Freq: Once | OROMUCOSAL | Status: AC

## 2020-04-21 MED ORDER — OXYCODONE HCL 5 MG/5ML PO SOLN: 5.0000 mg | Freq: Once | ORAL | Status: DC | PRN

## 2020-04-21 MED ORDER — CEFAZOLIN SODIUM-DEXTROSE 2-3 GM-%(50ML) IV SOLR
INTRAVENOUS | Status: DC | PRN
  Administered 2020-04-21 (×2): 2 g via INTRAVENOUS

## 2020-04-21 MED ORDER — CHLORHEXIDINE GLUCONATE 0.12 % MT SOLN
15.0000 mL | Freq: Once | OROMUCOSAL | Status: DC
Start: 2020-04-21 — End: 2020-04-21

## 2020-04-21 MED ORDER — MENTHOL 3 MG MT LOZG: 1.0000 | LOZENGE | OROMUCOSAL | Status: DC | PRN

## 2020-04-21 MED ORDER — HYDROMORPHONE 1 MG/ML IV SOLN
INTRAVENOUS | Status: AC
  Filled 2020-04-21: qty 30

## 2020-04-21 MED ORDER — DEXMEDETOMIDINE (PRECEDEX) IN NS 20 MCG/5ML (4 MCG/ML) IV SYRINGE
PREFILLED_SYRINGE | INTRAVENOUS | Status: DC | PRN
  Administered 2020-04-21: 8 ug via INTRAVENOUS
  Administered 2020-04-21: 12 ug via INTRAVENOUS

## 2020-04-21 MED ORDER — SIMETHICONE 80 MG PO CHEW
80.0000 mg | CHEWABLE_TABLET | Freq: Four times a day (QID) | ORAL | Status: DC | PRN
  Administered 2020-04-22 – 2020-04-23 (×2): 80 mg via ORAL
  Filled 2020-04-21 (×2): qty 1

## 2020-04-21 MED ORDER — METHYLENE BLUE 0.5 % INJ SOLN
INTRAVENOUS | Status: DC | PRN
  Administered 2020-04-21 (×2): 12.5 mg via INTRAVENOUS

## 2020-04-21 MED ORDER — KETOROLAC TROMETHAMINE 30 MG/ML IJ SOLN
INTRAMUSCULAR | Status: DC | PRN
Start: 2020-04-21 — End: 2020-04-21
  Administered 2020-04-21: 30 mg via INTRAVENOUS

## 2020-04-21 MED ORDER — SODIUM CHLORIDE 0.9 % IR SOLN
Status: DC | PRN
  Administered 2020-04-21 (×2): 3000 mL via INTRAVESICAL

## 2020-04-21 MED ORDER — OXYCODONE HCL 5 MG PO TABS: 5.0000 mg | ORAL_TABLET | Freq: Once | ORAL | Status: DC | PRN

## 2020-04-21 MED ORDER — FENTANYL CITRATE (PF) 100 MCG/2ML IJ SOLN
INTRAMUSCULAR | Status: DC | PRN
  Administered 2020-04-21 (×4): 50 ug via INTRAVENOUS
  Administered 2020-04-21: 100 ug via INTRAVENOUS
  Administered 2020-04-21 (×4): 50 ug via INTRAVENOUS

## 2020-04-21 MED ORDER — OXYCODONE-ACETAMINOPHEN 5-325 MG PO TABS
1.0000 | ORAL_TABLET | Freq: Four times a day (QID) | ORAL | Status: DC | PRN
  Administered 2020-04-22 – 2020-04-23 (×2): 2 via ORAL
  Filled 2020-04-21 (×2): qty 2

## 2020-04-21 MED ORDER — ALBUMIN HUMAN 5 % IV SOLN: INTRAVENOUS | Status: DC | PRN

## 2020-04-21 MED ORDER — ONDANSETRON HCL 4 MG/2ML IJ SOLN
INTRAMUSCULAR | Status: DC | PRN
  Administered 2020-04-21: 4 mg via INTRAVENOUS

## 2020-04-21 MED ORDER — CHLORHEXIDINE GLUCONATE 0.12 % MT SOLN
15.0000 mL | Freq: Once | OROMUCOSAL | Status: AC
  Administered 2020-04-21: 15 mL via OROMUCOSAL
  Filled 2020-04-21: qty 15

## 2020-04-21 MED ORDER — DOCUSATE SODIUM 100 MG PO CAPS
100.0000 mg | ORAL_CAPSULE | Freq: Two times a day (BID) | ORAL | Status: DC
  Administered 2020-04-21 – 2020-04-23 (×4): 100 mg via ORAL
  Filled 2020-04-21 (×4): qty 1

## 2020-04-21 MED ORDER — CEFAZOLIN SODIUM-DEXTROSE 2-4 GM/100ML-% IV SOLN
INTRAVENOUS | Status: AC
  Filled 2020-04-21: qty 100

## 2020-04-21 MED ORDER — METHYLENE BLUE 0.5 % INJ SOLN
INTRAVENOUS | Status: AC
  Filled 2020-04-21: qty 10

## 2020-04-21 MED ORDER — PROPOFOL 10 MG/ML IV BOLUS
INTRAVENOUS | Status: DC | PRN
  Administered 2020-04-21: 140 mg via INTRAVENOUS

## 2020-04-21 MED ORDER — DIPHENHYDRAMINE HCL 50 MG/ML IJ SOLN: 12.5000 mg | Freq: Four times a day (QID) | INTRAMUSCULAR | Status: DC | PRN

## 2020-04-21 MED ORDER — ONDANSETRON HCL 4 MG/2ML IJ SOLN: 4.0000 mg | Freq: Once | INTRAMUSCULAR | Status: DC | PRN

## 2020-04-21 MED ORDER — DIPHENHYDRAMINE HCL 12.5 MG/5ML PO ELIX: 12.5000 mg | ORAL_SOLUTION | Freq: Four times a day (QID) | ORAL | Status: DC | PRN

## 2020-04-21 MED ORDER — ROCURONIUM BROMIDE 10 MG/ML (PF) SYRINGE
PREFILLED_SYRINGE | INTRAVENOUS | Status: DC | PRN
  Administered 2020-04-21: 20 mg via INTRAVENOUS
  Administered 2020-04-21: 60 mg via INTRAVENOUS
  Administered 2020-04-21 (×3): 20 mg via INTRAVENOUS

## 2020-04-21 MED ORDER — DEXAMETHASONE SODIUM PHOSPHATE 10 MG/ML IJ SOLN
INTRAMUSCULAR | Status: DC | PRN
  Administered 2020-04-21: 8 mg via INTRAVENOUS

## 2020-04-21 MED ORDER — FENTANYL CITRATE (PF) 100 MCG/2ML IJ SOLN
25.0000 ug | INTRAMUSCULAR | Status: DC | PRN
  Administered 2020-04-21: 50 ug via INTRAVENOUS

## 2020-04-21 MED ORDER — MIDAZOLAM HCL 5 MG/5ML IJ SOLN
INTRAMUSCULAR | Status: DC | PRN
  Administered 2020-04-21: 2 mg via INTRAVENOUS

## 2020-04-21 MED ORDER — LIDOCAINE-EPINEPHRINE 1 %-1:100000 IJ SOLN
INTRAMUSCULAR | Status: DC | PRN
  Administered 2020-04-21: 6 mL

## 2020-04-21 MED ORDER — PHENYLEPHRINE HCL-NACL 10-0.9 MG/250ML-% IV SOLN
INTRAVENOUS | Status: DC | PRN
  Administered 2020-04-21: 10 ug/min via INTRAVENOUS

## 2020-04-21 MED ORDER — MIDAZOLAM HCL 2 MG/2ML IJ SOLN
INTRAMUSCULAR | Status: AC
  Filled 2020-04-21: qty 2

## 2020-04-21 SURGICAL SUPPLY — 87 items
ADH SKN CLS APL DERMABOND .7 (GAUZE/BANDAGES/DRESSINGS) ×3
APL SKNCLS STERI-STRIP NONHPOA (GAUZE/BANDAGES/DRESSINGS) ×3
BAG DRN RND TRDRP ANRFLXCHMBR (UROLOGICAL SUPPLIES) ×3
BAG SPEC RTRVL LRG 6X4 10 (ENDOMECHANICALS)
BAG URINE DRAIN 2000ML AR STRL (UROLOGICAL SUPPLIES) ×5 IMPLANT
BENZOIN TINCTURE PRP APPL 2/3 (GAUZE/BANDAGES/DRESSINGS) ×5 IMPLANT
BLADE SURG 10 STRL SS (BLADE) ×5 IMPLANT
CABLE HIGH FREQUENCY MONO STRZ (ELECTRODE) IMPLANT
CATH FOLEY 2WAY SLVR  5CC 14FR (CATHETERS) ×5
CATH FOLEY 2WAY SLVR 5CC 14FR (CATHETERS) ×3 IMPLANT
CLOSURE STERI-STRIP 1/2X4 (GAUZE/BANDAGES/DRESSINGS) ×1
CLOSURE WOUND 1/2 X4 (GAUZE/BANDAGES/DRESSINGS) ×1
CLOSURE WOUND 1/4 X3 (GAUZE/BANDAGES/DRESSINGS) ×1
CLSR STERI-STRIP ANTIMIC 1/2X4 (GAUZE/BANDAGES/DRESSINGS) ×4 IMPLANT
COUNTER NEEDLE 20 DBL MAG RED (NEEDLE) ×5 IMPLANT
COVER BACK TABLE 60X90IN (DRAPES) ×5 IMPLANT
COVER MAYO STAND STRL (DRAPES) ×5 IMPLANT
DECANTER SPIKE VIAL GLASS SM (MISCELLANEOUS) ×5 IMPLANT
DERMABOND ADVANCED (GAUZE/BANDAGES/DRESSINGS) ×2
DERMABOND ADVANCED .7 DNX12 (GAUZE/BANDAGES/DRESSINGS) ×3 IMPLANT
DRSG COVADERM PLUS 2X2 (GAUZE/BANDAGES/DRESSINGS) ×15 IMPLANT
DRSG OPSITE POSTOP 3X4 (GAUZE/BANDAGES/DRESSINGS) IMPLANT
DRSG OPSITE POSTOP 4X10 (GAUZE/BANDAGES/DRESSINGS) ×10 IMPLANT
DURAPREP 26ML APPLICATOR (WOUND CARE) ×5 IMPLANT
ELECT BLADE 6.5 EXT (BLADE) IMPLANT
ELECT REM PT RETURN 9FT ADLT (ELECTROSURGICAL) ×5
ELECTRODE REM PT RTRN 9FT ADLT (ELECTROSURGICAL) ×3 IMPLANT
GLOVE BIO SURGEON STRL SZ 6.5 (GLOVE) ×12 IMPLANT
GLOVE BIO SURGEONS STRL SZ 6.5 (GLOVE) ×3
GLOVE BIOGEL PI IND STRL 7.0 (GLOVE) ×21 IMPLANT
GLOVE BIOGEL PI INDICATOR 7.0 (GLOVE) ×14
GLOVE SURG SS PI 7.0 STRL IVOR (GLOVE) ×5 IMPLANT
HEMOSTAT ARISTA ABSORB 3G PWDR (HEMOSTASIS) ×5 IMPLANT
KIT BASIN OR (CUSTOM PROCEDURE TRAY) ×5 IMPLANT
KIT TURNOVER KIT B (KITS) ×5 IMPLANT
LEGGING LITHOTOMY PAIR STRL (DRAPES) ×5 IMPLANT
LIGASURE LAP MARYLAND 5MM 37CM (ELECTROSURGICAL) ×5 IMPLANT
LIGASURE VESSEL 5MM BLUNT TIP (ELECTROSURGICAL) ×5 IMPLANT
NEEDLE INSUFFLATION 14GA 120MM (NEEDLE) ×5 IMPLANT
NS IRRIG 1000ML POUR BTL (IV SOLUTION) ×5 IMPLANT
PACK LAVH (CUSTOM PROCEDURE TRAY) ×5 IMPLANT
PACK ROBOTIC GOWN (GOWN DISPOSABLE) ×5 IMPLANT
PACK TRENDGUARD 450 HYBRID PRO (MISCELLANEOUS) ×3 IMPLANT
POUCH SPECIMEN RETRIEVAL 10MM (ENDOMECHANICALS) IMPLANT
PROTECTOR NERVE ULNAR (MISCELLANEOUS) ×10 IMPLANT
SET CYSTO W/LG BORE CLAMP LF (SET/KITS/TRAYS/PACK) ×10 IMPLANT
SET IRRIG TUBING LAPAROSCOPIC (IRRIGATION / IRRIGATOR) ×5 IMPLANT
SET TUBE SMOKE EVAC HIGH FLOW (TUBING) ×5 IMPLANT
SLEEVE ENDOPATH XCEL 5M (ENDOMECHANICALS) ×5 IMPLANT
SOLUTION ELECTROLUBE (MISCELLANEOUS) ×5 IMPLANT
SPECIMEN JAR MEDIUM (MISCELLANEOUS) ×5 IMPLANT
SPONGE LAP 18X18 RF (DISPOSABLE) ×25 IMPLANT
STRIP CLOSURE SKIN 1/2X4 (GAUZE/BANDAGES/DRESSINGS) ×4 IMPLANT
STRIP CLOSURE SKIN 1/4X3 (GAUZE/BANDAGES/DRESSINGS) ×4 IMPLANT
SUT MNCRL AB 3-0 PS2 27 (SUTURE) ×5 IMPLANT
SUT MNCRL AB 4-0 PS2 18 (SUTURE) ×5 IMPLANT
SUT MON AB 3-0 SH 27 (SUTURE) ×10
SUT MON AB 3-0 SH27 (SUTURE) ×6 IMPLANT
SUT PLAIN 2 0 (SUTURE) ×5
SUT PLAIN ABS 2-0 CT1 27XMFL (SUTURE) ×3 IMPLANT
SUT PROLENE 0 CT 1 30 (SUTURE) IMPLANT
SUT PROLENE 0 SH 30 (SUTURE) ×5 IMPLANT
SUT VIC AB 0 CT1 18XCR BRD8 (SUTURE) ×6 IMPLANT
SUT VIC AB 0 CT1 27 (SUTURE) ×100
SUT VIC AB 0 CT1 27XBRD ANBCTR (SUTURE) ×60 IMPLANT
SUT VIC AB 0 CT1 8-18 (SUTURE) ×10
SUT VIC AB 1 CT1 36 (SUTURE) IMPLANT
SUT VIC AB 2-0 CT1 (SUTURE) ×20 IMPLANT
SUT VIC AB 2-0 CT1 27 (SUTURE) ×5
SUT VIC AB 2-0 CT1 TAPERPNT 27 (SUTURE) ×3 IMPLANT
SUT VIC AB 2-0 SH 27 (SUTURE) ×5
SUT VIC AB 2-0 SH 27XBRD (SUTURE) ×3 IMPLANT
SUT VIC AB 3-0 SH 27 (SUTURE) ×10
SUT VIC AB 3-0 SH 27X BRD (SUTURE) ×6 IMPLANT
SUT VICRYL 0 TIES 12 18 (SUTURE) ×10 IMPLANT
SUT VICRYL 0 UR6 27IN ABS (SUTURE) ×10 IMPLANT
SUT VICRYL 4-0 PS2 18IN ABS (SUTURE) IMPLANT
SYR 10ML LL (SYRINGE) ×5 IMPLANT
SYR TOOMEY 50ML (SYRINGE) ×5 IMPLANT
TOWEL GREEN STERILE FF (TOWEL DISPOSABLE) ×10 IMPLANT
TRAY FOLEY W/BAG SLVR 14FR (SET/KITS/TRAYS/PACK) ×5 IMPLANT
TRENDGUARD 450 HYBRID PRO PACK (MISCELLANEOUS) ×5
TROCAR BALLN 12MMX100 BLUNT (TROCAR) ×5 IMPLANT
TROCAR XCEL NON-BLD 11X100MML (ENDOMECHANICALS) IMPLANT
TROCAR XCEL NON-BLD 5MMX100MML (ENDOMECHANICALS) ×5 IMPLANT
UNDERPAD 30X36 HEAVY ABSORB (UNDERPADS AND DIAPERS) ×5 IMPLANT
WARMER LAPAROSCOPE (MISCELLANEOUS) ×5 IMPLANT

## 2020-04-21 NOTE — Op Note (Addendum)
Katie Mclaughlin DOB: 1971-03-21 MRN: [id number]   Pre-op diagnosis:  1. Menorrhagia  2. Fibroids 3. Failed conservative management  4. Desire to decrease future risk of ovarian and fallopian tube cancer  5. 1 prior cesarean section.    Post-op diagnosis: Same as above and  2.  Scar tissue on the bladder with adherence of bladder to uterus.  3.  Cystotomy   Surgeries:  1. Attempted Laparoscopic Vaginal hysterectomy converted to abdominal hysterectomy, bilateral salpingectomy. 2. Cystoscopy. 3. Bladder cystotomy repair.      Surgeon: Dr. Hoover Browns    Assistant: Henreitta Leber, PA  Intra-op consultation: Urology   Anesthesia: General ETA   Complications: Cystotomy.   EBL: 750 cc  IV fluids: 2600 cc  Urine output: 1250 cc.   Findings: 12 week sized uterus with fibroids.  Normal right and left fallopian tubes.  Bladder with 3cm cystotomy at the dome.       Indications: 49 y/o P1 with a long history of symptomatic fibroids who has failed conservative medical  treatment and D & C Hysteroscopy now desiring definitive treatment by hysterectomy.  Patient also desired to decrease her future risk of fallopian tube and ovarian cancer by bilateral fallopian tube removal.       Procedure: Informed consent was obtained from the patient. She was taken to the operating room where anesthesia was administered.  She was carefully positioned in the operating room table in dorsal lithotomy position with both arms tucked to her sides.  An exam under anesthesia revealed a 12 week sized uterus with no palpable adnexal masses. She was prepped and draped in the usual sterile fashion.   Speculum was used to view the cervix.  Hulka tenaculum was placed into cervix.  Foley catheter placed in the bladder.    Attention was then turned to the abdomen.  1% lidocaine with epinephrine was injected in umbilical area. 10 mm incision made in infra umbilical area.  Open entry through the abdomen layers done to enter  the peritoneum.  11mm Hasson trocar was placed in and abdomen insufflated with CO2 gas.  Patient placed in trendelenburg and above findings noted.  Two other 5mm ports were placed in mid abdominal quadrants under direct visualization 3 fingerbreadths medial and 5 fingerbreadths above the anterior superior iliac spine.  The abdomen was surveyed with above findings.  The left fallopian tube was then excised with the ligasure and placed in the cul de sac.  The left uterosacral and left round ligament were also transected.  Attention was turned to the right side where the right fallopian tube was excised.  At this point there was blood pooling noted in the cul de sac.  The left pedicles were inspected and re cauterized, bleeding noted to come from the left cornua.  Despite several attempts at fulguration with ligasure the bleeding continued.  There was also difficulty visualizing the field with laparoscopy camera therefore decision was made to proceed with laparotomy for concerns of heavy bleeding.  All laparocopic instruments were then removed.      A pfannenstiel incision was made over the old prior incision with the scalpel and carried through the underlying subcutaneous layer and fascia with the bovie.  Small perforators on the layers were contained with the bovie.  The fascia was nicked in the midline and the fascia separated from the rectus muscles superiorly, inferiorly and bilaterally.  The rectus muscle was separated in the midline, peritoneum entered.  The Lenox Ahr retractor was placed in after  packing away the bowel.  Bleeding was noted to come from the left uterine cornua a a long kelly clamp was placed over the cornua with good hemostasis. Another kelly clamp placed in the right cornua and the  uterus was elevated out of the uterus. The round ligament was identified on the right side and doubley clamped with kelly clamps.  It was bisected with bovie and the pedicles sutured.  0-vicryl suture  was used unless stated otherwise. The anterior broad ligament was entered with the metzenbaum scissors and incision extended anteriorly to create the bladder flap.  Gauze pad on a ring forcep was used to move the bladder down.  An avascular area was identified in the posterior broad ligament and this was entered with the bovie.  The utero-ovarian ligament was grasped through this window and doubly clamped and cut. Free tie then suture ligature was placed on the lateral pedicles and suture ligature placed on the medial pedicle.  The broad ligamanet tissues overlying the uterine arteries were dissected away and uterine artery identified at the utero-cervical junction.  It was doubly clamped and straight kocher placed on lateral uterus. The vessel between kocher and heaney clamp was cut and uterine vessel was doubly ligated.  Straight Heaney clamp was placed on the cervix medial to the uterine artery pedicles and cardinal ligament transected and transfixing stitch placed in.  A similar procedure was done on the left side of the uterus to get the left uterine arteries and cardinal and utero sacral ligaments.  The uterus was then transected at this point at the uterine-cervical junction and handed off as specimen. The pubo-vesical-cervical fascia was cut with the the scalpel and mobilized anteriorly with the gauze pad on a right forcep.  Bladder scarring noted at this level.  Straight Heaney clamps were further used to match down the cervix bilaterally, getting the cardinal ligaments and then the uterosacral ligaments, ensuring to stay medial to the prior pedicle.  Transfixing stitch was used under the pedicles bilaterally.  Two heaney were then placed below the cervix to meet at the center and the cervix was cut loose with a Jorgenson's scissors.  The vaginal tissue was grasped with Kochers and sutured with figure of eight stitches.  The vaginal cuff angles were suspended to the cardinal and uterosacral ligaments by  tying the suture ends of the vaginal cuff to the tagged sutures of the ligaments.  There was some bleeding near the vaginal cuff noted and figure of eight sutures were placed.  At this point patient's urine was noted to be blood tinged.  Arista was placed over the vaginal cuff and attention turned to the bladder where cystoscopy was perfomed.       Cystoscopy revealed a blood clot at the bladder dome and efflux of cystoscopy fluid into the abdomen. At this point urology was consulted.  Repair of the 3 cm cystotomy at the bladder dome was done under guidance of the urology team in a 2 layer imbricating closure with 3-0 and 2-0 vicryl.  Bladder was back filled with 200 cc fluid and no more efflux noted into the abdomen.  Repeat cystoscopy was done with limited views due to blood clot in the bladder.  A new foley catheter was placed into her bladder.  Attention was turned back to the abdomen where the vaginal cuff and pedicles were re inspected and noted to be hemostatic.  The retractor and packing removed.  The muscle and peritoneum was reapproximated with 0-chromic interrupted stitches.  The fascia was closed off using 0-vicryl in a running stitch. The subcutaneous layer was closed off with plain suture.  The skin incisions were closed off with 3-0 monocryl in a subcuticular stitch.  Benzocaine, steristrip, honey comb and cover derm dressing were then placed over the incisions.   Final count was correct.  She was then cleaned, awoken from anesthesia and taken to recovery room in stable condition.    Specimen:  Uterus with cervix.  Right and left fallopian tubes.        Disposition: TO PACU in stable condition.  Dr.Brexlee Heberlein.  04/21/2020 1920

## 2020-04-21 NOTE — Transfer of Care (Signed)
Immediate Anesthesia Transfer of Care Note  Patient: Katie Mclaughlin  Procedure(s) Performed: LAPAROSCOPIC ASSISTED VAGINAL HYSTERECTOMY WITH SALPINGECTOMY (Bilateral Abdomen) LAPAROTOMY (N/A Uterus) CYSTOSCOPY (N/A Abdomen)  Patient Location: PACU  Anesthesia Type:General  Level of Consciousness: responds to stimulation  Airway & Oxygen Therapy: Patient Spontanous Breathing and Patient connected to face mask oxygen  Post-op Assessment: Report given to RN and Post -op Vital signs reviewed and stable  Post vital signs: Reviewed and stable  Last Vitals:  Vitals Value Taken Time  BP 94/61 04/21/20 1932  Temp 97.8   Pulse 85 04/21/20 1933  Resp 17 04/21/20 1933  SpO2 100 % 04/21/20 1933  Vitals shown include unvalidated device data.  Last Pain:  Vitals:   04/21/20 1206  TempSrc:   PainSc: 0-No pain         Complications: No complications documented.

## 2020-04-21 NOTE — Consult Note (Signed)
Urology Consult   Physician requesting consult: Waymon Amato, MD  Reason for consult: Intraoperative consult for bladder injury  History of Present Illness: Katie Mclaughlin is a 49 y.o. female with history of menorrhagia, fibroids and pelvic pain undergoing open hysterectomy with bilateral salpingectomy for whom urology was consulted intraoperatively for recommendations regarding repair of bladder injury.   Per Dr. Alesia Richards, the uterus was noted to be relatively adherent to the dome of the bladder during the case, and an approximately 2 cm cystarotomy was made. The edges were visible and clean, extending along the bladder dome. I recommended primary closure in 2 layers, which Dr. Alesia Richards performed using 3-0 followed by 2-0 vicryl. A catheter was then placed and filled with 200cc sterile saline, which confirmed a watertight closure. Dr. Alesia Richards then proceeded with the remainder of her case.    Past Medical History:  Diagnosis Date  . Anemia   . BRONCHITIS, ACUTE 07/14/2010  . Dyspnea   . Hypothyroidism     Past Surgical History:  Procedure Laterality Date  . CESAREAN SECTION    . FRACTURE SURGERY     nose fracture sx. at 49 years old  . HYSTEROSCOPY WITH D & C N/A 08/14/2018   Procedure: DILATATION AND CURETTAGE /HYSTEROSCOPY;  Surgeon: Eldred Manges, MD;  Location: [facility];  Service: Gynecology;  Laterality: N/A;  . TONSILLECTOMY       Current Hospital Medications:  Home meds:  No current facility-administered medications on file prior to encounter.   Current Outpatient Medications on File Prior to Encounter  Medication Sig Dispense Refill  . Cholecalciferol (VITAMIN D3) 125 MCG (5000 UT) TABS Take 5,000 Units by mouth daily.     . ferrous sulfate 325 (65 FE) MG tablet Take 650 mg by mouth daily.     Marland Kitchen ibuprofen (ADVIL) 200 MG tablet Take 400 mg by mouth every 6 (six) hours as needed for headache or moderate pain.    . Naphazoline HCl (CLEAR EYES OP) Place 1 drop into both eyes  daily as needed (dry eyes).    . norethindrone (MICRONOR) 0.35 MG tablet Take 1 tablet by mouth daily.    . rosuvastatin (CRESTOR) 20 MG tablet Take 1 tablet (20 mg total) by mouth daily. 30 tablet 2  . diphenhydrAMINE (BENADRYL) 25 MG tablet Take 25 mg by mouth daily as needed for allergies.       Scheduled Meds: Continuous Infusions: . lactated ringers 10 mL/hr at 04/21/20 1213   PRN Meds:.0.9 % irrigation (POUR BTL), hemostatic agents, lidocaine-EPINEPHrine, sodium chloride irrigation  Allergies: No Known Allergies  Family History  Problem Relation Age of Onset  . Diabetes Mother   . Migraines Mother   . Hypertension Mother   . Hyperlipidemia Mother   . Diabetes Father   . Hypertension Father   . Hyperlipidemia Father   . Hypertension Brother     Social History:  reports that she has never smoked. She has never used smokeless tobacco. She reports that she does not drink alcohol and does not use drugs.  ROS: A complete review of systems was performed.  All systems are negative except for pertinent findings as noted.  Physical Exam:  Vital signs in last 24 hours: Temp:  [98.2 F (36.8 C)] 98.2 F (36.8 C) (08/02 1128) Pulse Rate:  [86] 86 (08/02 1128) Resp:  [18] 18 (08/02 1128) BP: (128)/(79) 128/79 (08/02 1128) SpO2:  [100 %] 100 % (08/02 1128) Weight:  [86.2 kg] 86.2 kg (08/02 1128) Constitutional:  Intubated, sedated intraop Cardiovascular: Regular rate and rhythm Respiratory: mechanically ventilated GI: open abdomen, 2cm opening at the bladder dome, closed in 2 layers Neurologic: sedated Psychiatric: sedated  Laboratory Data:  No results for input(s): WBC, HGB, HCT, PLT in the last 72 hours.  No results for input(s): NA, K, CL, GLUCOSE, BUN, CALCIUM, CREATININE in the last 72 hours.  Invalid input(s): CO3   Results for orders placed or performed during the hospital encounter of 04/21/20 (from the past 24 hour(s))  Pregnancy, urine POC     Status: None    Collection Time: 04/21/20 11:46 AM  Result Value Ref Range   Preg Test, Ur NEGATIVE NEGATIVE  Type and screen [place]     Status: None   Collection Time: 04/21/20 11:55 AM  Result Value Ref Range   ABO/RH(D) AB POS    Antibody Screen NEG    Sample Expiration      04/24/2020,2359 Performed at [facility], [place] [address] [place], [place] [address]    Recent Results (from the past 240 hour(s))  SARS CORONAVIRUS 2 (TAT 6-24 HRS) Nasopharyngeal Nasopharyngeal Swab     Status: None   Collection Time: 04/18/20 11:28 AM   Specimen: Nasopharyngeal Swab  Result Value Ref Range Status   SARS Coronavirus 2 NEGATIVE NEGATIVE Final    Comment: (NOTE) SARS-CoV-2 target nucleic acids are NOT DETECTED.  The SARS-CoV-2 RNA is generally detectable in upper and lower respiratory specimens during the acute phase of infection. Negative results do not preclude SARS-CoV-2 infection, do not rule out co-infections with other pathogens, and should not be used as the sole basis for treatment or other patient management decisions. Negative results must be combined with clinical observations, patient history, and epidemiological information. The expected result is Negative.  Fact Sheet for Patients: SugarRoll.be  Fact Sheet for Healthcare Providers: https://www.woods-mathews.com/  This test is not yet approved or cleared by the [place] FDA and  has been authorized for detection and/or diagnosis of SARS-CoV-2 by FDA under an Emergency Use Authorization (EUA). This EUA will remain  in effect (meaning this test can be used) for the duration of the COVID-19 declaration under Se ction 564(b)(1) of the Act, 21 U.S.C. section 360bbb-3(b)(1), unless the authorization is terminated or revoked sooner.  Performed at [facility], [place] [address], [place], [place] [address]      Impression/Recommendation: 49 y.o. female for  whom urology was consulted for assistance with intraoperative bladder repair. Repair performed by primary surgeon, no evidence of urine leak following closure.  -Recommend continuing foley catheter for 10-14 days -Recommend obtaining a CT cystogram (CT pelvis with contrast, in comments specify NO IV CONTRAST, for cystogram -- please fill bladder with 300cc contrast via catheter) on day of or day prior to clinic follow up. If no evidence of contrast extravasation, ok to remove catheter and perform trial of void in clinic.  -Would recommend bactrim DS x 3 days to begin day prior to catheter removal, end day after removal.   Carmie Kanner 04/21/2020, 6:42 PM

## 2020-04-21 NOTE — Interval H&P Note (Signed)
History and Physical Interval Note:  04/21/2020 1:20 PM  Katie Mclaughlin  has presented today for surgery, with the diagnosis of PELVIC PAIN.  The various methods of treatment have been discussed with the patient and family. After consideration of risks, benefits and other options for treatment, the patient has consented to  Procedure(s): LAPAROSCOPIC ASSISTED VAGINAL HYSTERECTOMY WITH SALPINGECTOMY (Bilateral) LAPAROTOMY (N/A) as a surgical intervention.  POSSIBLE LAPAROTOMY. PATIENT IS ALSO FOR CYSTOSCOPY. The patient's history has been reviewed, patient examined, no change in status, stable for surgery.  I have reviewed the patient's chart and labs.  Questions were answered to the patient's satisfaction.     Alinda Dooms, MD.

## 2020-04-21 NOTE — Anesthesia Procedure Notes (Signed)
Procedure Name: Intubation Date/Time: 04/21/2020 1:44 PM Performed by: Lowella Dell, CRNA Pre-anesthesia Checklist: Patient identified, Emergency Drugs available, Suction available and Patient being monitored Patient Re-evaluated:Patient Re-evaluated prior to induction Oxygen Delivery Method: Circle System Utilized Preoxygenation: Pre-oxygenation with 100% oxygen Induction Type: IV induction Ventilation: Mask ventilation without difficulty Laryngoscope Size: Glidescope and 3 Grade View: Grade II Tube type: Oral Tube size: 6.5 mm Number of attempts: 2 (see note below) Airway Equipment and Method: Stylet Placement Confirmation: ETT inserted through vocal cords under direct vision,  positive ETCO2 and breath sounds checked- equal and bilateral Secured at: 22 cm Tube secured with: Tape Dental Injury: Teeth and Oropharynx as per pre-operative assessment  Comments: DL x1 with Mac 3, Gr IV view -- likely made more difficult by Tren-Guard positioning device in place.  Decision to proceed directly to small (green) GlideScope, resulting in Gr II view and atraumatic intubation. Easy mask, with ventilation provided between looks, vital signs stable throughout.

## 2020-04-21 NOTE — Plan of Care (Signed)
  Problem: Education: Goal: Knowledge of General Education information will improve Description Including pain rating scale, medication(s)/side effects and non-pharmacologic comfort measures Outcome: Progressing   

## 2020-04-21 NOTE — Anesthesia Postprocedure Evaluation (Signed)
Anesthesia Post Note  Patient: Katie Mclaughlin  Procedure(s) Performed: LAPAROSCOPIC ASSISTED VAGINAL HYSTERECTOMY WITH SALPINGECTOMY (Bilateral Abdomen) LAPAROTOMY (N/A Uterus) CYSTOSCOPY (N/A Abdomen)     Patient location during evaluation: PACU Anesthesia Type: General Level of consciousness: awake and alert Pain management: pain level controlled Vital Signs Assessment: post-procedure vital signs reviewed and stable Respiratory status: spontaneous breathing, nonlabored ventilation, respiratory function stable and patient connected to nasal cannula oxygen Cardiovascular status: blood pressure returned to baseline and stable Postop Assessment: no apparent nausea or vomiting Anesthetic complications: no   No complications documented.  Last Vitals:  Vitals:   04/21/20 2019 04/21/20 2034  BP: 118/73 122/74  Pulse: 73 71  Resp: 18 (!) 21  Temp: 36.8 C   SpO2: 100% 100%                 Pinkie Manger,W. EDMOND

## 2020-04-22 ENCOUNTER — Other Ambulatory Visit: Payer: Self-pay | Admitting: Obstetrics and Gynecology

## 2020-04-22 ENCOUNTER — Encounter (HOSPITAL_COMMUNITY): Payer: Self-pay | Admitting: Obstetrics & Gynecology

## 2020-04-22 DIAGNOSIS — N9981 Other intraoperative complications of genitourinary system: Secondary | ICD-10-CM | POA: Insufficient documentation

## 2020-04-22 LAB — BASIC METABOLIC PANEL
Anion gap: 11 (ref 5–15)
BUN: 5 mg/dL — ABNORMAL LOW (ref 6–20)
CO2: 22 mmol/L (ref 22–32)
Calcium: 8.1 mg/dL — ABNORMAL LOW (ref 8.9–10.3)
Chloride: 101 mmol/L (ref 98–111)
Creatinine, Ser: 0.58 mg/dL (ref 0.44–1.00)
GFR calc Af Amer: >60 mL/min (ref >60)
GFR calc non Af Amer: >60 mL/min (ref >60)
Glucose, Bld: 157 mg/dL — ABNORMAL HIGH (ref 70–99)
Potassium: 3.9 mmol/L (ref 3.5–5.1)
Sodium: 134 mmol/L — ABNORMAL LOW (ref 135–145)

## 2020-04-22 LAB — CBC
HCT: 33.8 % — ABNORMAL LOW (ref 36.0–46.0)
Hemoglobin: 10.7 g/dL — ABNORMAL LOW (ref 12.0–15.0)
MCH: 29.6 pg (ref 26.0–34.0)
MCHC: 31.7 g/dL (ref 30.0–36.0)
MCV: 93.4 fL (ref 80.0–100.0)
Platelets: 202 10*3/uL (ref 150–400)
RBC: 3.62 MIL/uL — ABNORMAL LOW (ref 3.87–5.11)
RDW: 12.8 % (ref 11.5–15.5)
WBC: 14.5 10*3/uL — ABNORMAL HIGH (ref 4.0–10.5)
nRBC: 0 % (ref 0.0–0.2)

## 2020-04-22 MED ORDER — CHLORHEXIDINE GLUCONATE CLOTH 2 % EX PADS
6.0000 | MEDICATED_PAD | Freq: Every day | CUTANEOUS | Status: DC
  Administered 2020-04-22 – 2020-04-23 (×2): 6 via TOPICAL

## 2020-04-22 MED ORDER — OXYBUTYNIN CHLORIDE 5 MG PO TABS
5.0000 mg | ORAL_TABLET | Freq: Two times a day (BID) | ORAL | Status: DC
  Administered 2020-04-22 – 2020-04-23 (×2): 5 mg via ORAL
  Filled 2020-04-22 (×2): qty 1

## 2020-04-22 NOTE — Progress Notes (Addendum)
Katie Mclaughlin is a4 y.o.  [id number]  Post Op Date # 1: Attempted LAVH/Abdominal Hysterectomy, Bilateral Salpingectomy/ Repair of Bladder Tear and Cystoscopy  Subjective: Patient is Doing well postoperatively. Patient has Pain is controlled with current analgesics. Medications being used: prescription NSAID's including Ketorolac 30 mg IV and narcotic analgesics including PCA Dilaudid. Patient ambulating with some dizziness, tolerating water and urine output is within acceptable range. Foley remains in place and is draining.   Objective: Vital signs in last 24 hours: Temp:  [97.8 F (36.6 C)-98.6 F (37 C)] 98.6 F (37 C) (08/03 0547) Pulse Rate:  [65-86] 72 (08/03 0547) Resp:  [15-21] 18 (08/03 0547) BP: (94-128)/(57-84) 112/58 (08/03 0547) SpO2:  [97 %-100 %] 97 % (08/03 0547) Weight:  [86.2 kg] 86.2 kg (08/02 1128)  Intake/Output from previous day: 08/02 0701 - 08/03 0700 In: 3999.6 [P.O.:100; I.V.:3549.6] Out: 2900 [Urine:2125] Intake/Output this shift: No intake/output data recorded. Recent Labs  Lab 04/18/20 1049 04/21/20 1933 04/22/20 0032  WBC 7.3 18.6* 14.5*  HGB 13.0 11.5* 10.7*  HCT 40.7 35.5* 33.8*  PLT 228 224 202     Recent Labs  Lab 04/22/20 0032  NA 134*  K 3.9  CL 101  CO2 22  BUN 5*  CREATININE 0.58  CALCIUM 8.1*  GLUCOSE 157*    EXAM: General: cooperative, fatigued and no distress Resp: clear to auscultation bilaterally Cardio: regular rate and rhythm, S1, S2 normal, no murmur, click, rub or gallop GI: good bowel sounds in all 4 quadrants; wound dressing is clean/dry/intact Extremities: Homans sign is negative, no sign of DVT and SCD hose in place and functioning; no calf tenderness.   Assessment: s/p Procedure(s): LAPAROSCOPIC ASSISTED VAGINAL HYSTERECTOMY WITH SALPINGECTOMY LAPAROTOMY CYSTOSCOPY: stable, progressing well and anemia  Plan: Advance diet Encourage ambulation Discontinue IV fluids Routine care.  Per Urology  Consult: Recommend continuing foley catheter for 10-14 days -Recommend obtaining a CT cystogram (CT pelvis with contrast, in comments specify NO IV CONTRAST, for cystogram -- please fill bladder with 300cc contrast via catheter) on day of or day prior to clinic follow up. If no evidence of contrast extravasation, ok to remove catheter and perform trial of void in clinic.  -Would recommend bactrim DS x 3 days to begin day prior to catheter removal, end day after removal.    LOS: 1 day    [name], PA-C 04/22/2020 7:25 AM  MD Addendum:  I saw and examined patient at bedside and agree with above findings, assessment and plan as outlined above by [name], PA-C.  I discussed with patient and husband already surgical procedure, complications and follow up care. She expressed understanding and was agreeable to plan of care.  I also spoke to Urology attending Dr. Gloriann Loan and he stated patient can have either CT cystogram or fluoroscopic cystogram by x-ray.  Will arrange for this as outpatient.  I also discussed with patient she may use iron tabs every other day as she has intolerance to iron tablets and mild anemia.  Dr. Alesia Richards.  04/22/2020.

## 2020-04-23 ENCOUNTER — Other Ambulatory Visit: Payer: Self-pay | Admitting: Obstetrics and Gynecology

## 2020-04-23 DIAGNOSIS — N9981 Other intraoperative complications of genitourinary system: Secondary | ICD-10-CM

## 2020-04-23 LAB — SURGICAL PATHOLOGY

## 2020-04-23 MED ORDER — OXYCODONE-ACETAMINOPHEN 5-325 MG PO TABS: ORAL_TABLET | ORAL | 0 refills | Status: DC

## 2020-04-23 MED ORDER — IBUPROFEN 600 MG PO TABS
ORAL_TABLET | ORAL | 1 refills | Status: DC
Start: 2020-04-23 — End: 2023-08-09

## 2020-04-23 MED ORDER — SULFAMETHOXAZOLE-TRIMETHOPRIM 800-160 MG PO TABS
ORAL_TABLET | ORAL | 0 refills | Status: DC
Start: 2020-04-23 — End: 2020-04-25

## 2020-04-23 MED ORDER — OXYBUTYNIN CHLORIDE ER 5 MG PO TB24
ORAL_TABLET | ORAL | 0 refills | Status: DC
Start: 2020-04-23 — End: 2022-05-06

## 2020-04-23 NOTE — Plan of Care (Signed)
  Problem: Education: Goal: Knowledge of General Education information will improve Description: Including pain rating scale, medication(s)/side effects and non-pharmacologic comfort measures Outcome: Progressing   Problem: Health Behavior/Discharge Planning: Goal: Ability to manage health-related needs will improve Outcome: Progressing   Problem: Clinical Measurements: Goal: Ability to maintain clinical measurements within normal limits will improve Outcome: Progressing   Problem: Activity: Goal: Risk for activity intolerance will decrease Outcome: Progressing   Problem: Nutrition: Goal: Adequate nutrition will be maintained Outcome: Progressing   Problem: Elimination: Goal: Will not experience complications related to urinary retention Outcome: Progressing   Problem: Pain Managment: Goal: General experience of comfort will improve Outcome: Progressing   Problem: Safety: Goal: Ability to remain free from injury will improve Outcome: Progressing   Problem: Skin Integrity: Goal: Risk for impaired skin integrity will decrease Outcome: Progressing   

## 2020-04-23 NOTE — Discharge Instructions (Signed)
Call [facility] @ [phone] if:  You have a temperature greater than or equal to 100.4 degrees Farenheit orally You have pain that is not made better by the pain medication given and taken as directed You have excessive bleeding or problems urinating  Take Colace (Docusate Sodium/Stool Softener) 100 mg 2-3 times daily while taking narcotic pain medicine to avoid constipation or until bowel movements are regular.  You may also take simethicone (Gas X) over the counter for gas pains as needed. Take your Ibuprofen 600 mg, with food, every 6 hours for the next 5 days then as needed for pain.  You should take the antibiotic Trimethoprim/Sulfamethoxazole DS twice a day, starting the day before you are scheduled to have your catheter removed.  You will be taking this medication for a total of 3 days.  Flush your bladder catheter once a day while the urine has blood in it, when urine clears up you may stop flushing the catheter.   If you should have bladder spasms,  you may take Ditropan 5 mg  (oxybutinin) as directed  For constipation, you may take any laxative of your choice or Miralax as directed Take any over the counter iron supplement of your choice,  twice every other day for the next 6 weeks. Increase your iron rich foods.. You may drive after 2  week You may walk up steps  You may shower, but minimize water exposure to the dressings.  If the dressings get wet just pat them dry with a towel.   You may resume a regular diet  Keep incisions clean and dry; remove your dressings on April 28, 2020 after saturating them with water to help loosen them.  You will notice steri strips on the incisions which you may leave in place.  As you continue showering daily the steri strips will usually loosen up and fall off by themselves.  If they don't fall off we can remove them in the office.   Do not lift over 15 pounds for 6 weeks Avoid anything in vagina for 6 weeks (or until after your  post-operative visit)

## 2020-04-23 NOTE — Progress Notes (Addendum)
Katie Mclaughlin is a4 y.o.  [id number]  Post Op Date # 2:  Attempted LAVH/Abdominal Hysterectomy, Bilateral Salpingectomy/ Repair of Bladder Tear and Cystoscopy  Subjective: Patient is Doing well postoperatively. Patient has Pain is controlled with current analgesics. Medications being used: prescription NSAID's including Ketorolac 30 IV and narcotic analgesics including Percocet 5/325 mg. Ambulating in halls alone with no further dizziness and tolerating full liquids (states she's not hungry for more).  Foley catheter in place and draining I/O since 7 pm = 650 cc/1050 cc.   Objective: Vital signs in last 24 hours: Temp:  [98.2 F (36.8 C)-99 F (37.2 C)] 98.4 F (36.9 C) (08/04 0422) Pulse Rate:  [70-88] 81 (08/04 0422) Resp:  [11-19] 15 (08/04 0422) BP: (102-135)/(61-74) 106/69 (08/04 0422) SpO2:  [94 %-100 %] 100 % (08/04 0422)  Intake/Output from previous day: 08/03 0701 - 08/04 0700 In: 2372.2 [P.O.:1090; I.V.:1282.2] Out: 3675 [Urine:3675] Intake/Output this shift: No intake/output data recorded. Recent Labs  Lab 04/18/20 1049 04/21/20 1933 04/22/20 0032  WBC 7.3 18.6* 14.5*  HGB 13.0 11.5* 10.7*  HCT 40.7 35.5* 33.8*  PLT 228 224 202     Recent Labs  Lab 04/22/20 0032  NA 134*  K 3.9  CL 101  CO2 22  BUN 5*  CREATININE 0.58  CALCIUM 8.1*  GLUCOSE 157*    EXAM: General: cooperative, fatigued and no distress Resp: clear to auscultation bilaterally Cardio: regular rate and rhythm, S1, S2 normal, no murmur, click, rub or gallop GI: bowel sounds present in all quadrants;  dressing is clean/dry/intact; abdominal binder in place and clean. Extremities: Homans sign is negative, no sign of DVT and no calf tenderness.   Assessment: s/p Procedure(s): Attempted Laparoscopic Assisted Vaginal Hysterectomy; Abdominal Hysterectomy with Bilateral salpingectomy CYSTORRHAPHY CLOSURE/ BLADDER REPAIR CYSTOSCOPY: stable, progressing well and anemia  Plan: Routine  care and will consider discharge home today.  LOS: 2 days    [name], PA-C 04/23/2020 7:10 AM  MD addendum: I saw and examined patient at bedside and agree with above findings, assessment and plan as outlined above by [name], PA-C.  Physical exam reveals good bowel sounds in all four quadrants.  Patient reports she has passed gas one time.  Will allow patient to have regular diet and see how she tolerates it before discharge.  We discussed incisions care, post op follow up and precautions.  All her questions were answered and she expressed understanding.  Dr. Alesia Richards.  04/23/2020.

## 2020-04-23 NOTE — Discharge Summary (Signed)
Physician Discharge Summary  Patient ID: Katie Mclaughlin MRN: [id number] DOB/AGE: 05-20-1971 49 y.o.  Admit date: 04/21/2020 Discharge date: 04/23/2020   Discharge Diagnoses:  Active Problems:   Fibroid, uterine   Operation: Attempted Total Laparoscopically Assisted Vaginal Hysterectomy; Total Abdominal Hysterectomy, Bilaterals Salpingectomy, Incidental Bladder Injury with Repair and Cystoscopy   Discharged Condition: Good  Hospital Course: On the date of admission the patient underwent the aforementioned procedures and tolerated them well.  Post operative course was unremarkable with the patient resuming bowel function by post operative day #2 and was therefore deemed ready for discharge home with a Foley catheter in place.  Discharge hemoglobin 10.7.  The patient to be scheduled for a Cystogram in 10-14 days, followed by catheter removal should bladder remain intact.  Disposition: Home to Self Care  Discharge Medications:  Allergies as of 04/23/2020   No Known Allergies     Medication List    STOP taking these medications   norethindrone 0.35 MG tablet Commonly known as: MICRONOR     TAKE these medications   CLEAR EYES OP Place 1 drop into both eyes daily as needed (dry eyes).   diphenhydrAMINE 25 MG tablet Commonly known as: BENADRYL Take 25 mg by mouth daily as needed for allergies.   ferrous sulfate 325 (65 FE) MG tablet Take 650 mg by mouth daily.   ibuprofen 600 MG tablet Commonly known as: ADVIL take 1 tablet po pc every 6 hours for 5 days then prn-post operative pain What changed:   medication strength  how much to take  how to take this  when to take this  reasons to take this  additional instructions   oxybutynin 5 MG 24 hr tablet Commonly known as: Ditropan XL take 1 tablet po tid prn for bladder spasms   oxyCODONE-acetaminophen 5-325 MG tablet Commonly known as: PERCOCET/ROXICET take 1-2 tablet po every 6 hours as needed for breakthrough post  operative pain   rosuvastatin 20 MG tablet Commonly known as: CRESTOR Take 1 tablet (20 mg total) by mouth daily.   sulfamethoxazole-trimethoprim 800-160 MG tablet Commonly known as: BACTRIM DS take 1 tablet po bid starting the day before catheter removal, until completed   Vitamin D3 125 MCG (5000 UT) Tabs Take 5,000 Units by mouth daily.         Follow-up: Dr. Waymon Amato on June 02, 2020 AT 9:45 a.m. The patient is  to be scheduled for a Cystogram within 10-14 days following surgery and if bladder remains intact, the Foley Catheter will be removed.     Signed: [name], PA-C 04/23/2020, 8:11 AM

## 2020-04-23 NOTE — Progress Notes (Signed)
AVS given and reviewed with pt. Medications discussed. Abdominal binder provided to pt. Care instructions regarding leg bag and foley bag related to foley catheter care discussed with pt. All questions answered to satisfaction. Pt verbalized understanding of information given. Pt to be escorted off the unit with all belongings via wheelchair by staff member.

## 2020-04-25 ENCOUNTER — Encounter (HOSPITAL_COMMUNITY): Payer: Self-pay | Admitting: Emergency Medicine

## 2020-04-25 ENCOUNTER — Other Ambulatory Visit: Payer: Self-pay

## 2020-04-25 ENCOUNTER — Emergency Department (HOSPITAL_COMMUNITY)
Admission: EM | Admit: 2020-04-25 | Discharge: 2020-04-25 | Disposition: A | Discharge status: Home / Self Care | Payer: BC Managed Care – PPO | Attending: Emergency Medicine | Admitting: Emergency Medicine

## 2020-04-25 DIAGNOSIS — R339 Retention of urine, unspecified: Secondary | ICD-10-CM | POA: Diagnosis not present

## 2020-04-25 DIAGNOSIS — R319 Hematuria, unspecified: Secondary | ICD-10-CM

## 2020-04-25 DIAGNOSIS — T83098A Other mechanical complication of other indwelling urethral catheter, initial encounter: Secondary | ICD-10-CM | POA: Diagnosis not present

## 2020-04-25 DIAGNOSIS — Z79899 Other long term (current) drug therapy: Secondary | ICD-10-CM | POA: Insufficient documentation

## 2020-04-25 DIAGNOSIS — Z1889 Other specified retained foreign body fragments: Secondary | ICD-10-CM | POA: Diagnosis not present

## 2020-04-25 DIAGNOSIS — E039 Hypothyroidism, unspecified: Secondary | ICD-10-CM | POA: Insufficient documentation

## 2020-04-25 LAB — URINALYSIS, ROUTINE W REFLEX MICROSCOPIC
Bilirubin Urine: NEGATIVE
Glucose, UA: NEGATIVE mg/dL
Ketones, ur: NEGATIVE mg/dL
Leukocytes,Ua: NEGATIVE
Nitrite: NEGATIVE
Protein, ur: 30 mg/dL — AB
RBC / HPF: >50 RBC/hpf — ABNORMAL HIGH (ref 0–5)
Specific Gravity, Urine: 1.005 (ref 1.005–1.030)
pH: 7 (ref 5.0–8.0)

## 2020-04-25 MED ORDER — SULFAMETHOXAZOLE-TRIMETHOPRIM 800-160 MG PO TABS
1.0000 | ORAL_TABLET | Freq: Once | ORAL | Status: AC
  Administered 2020-04-25: 1 via ORAL
  Filled 2020-04-25: qty 1

## 2020-04-25 MED ORDER — SULFAMETHOXAZOLE-TRIMETHOPRIM 800-160 MG PO TABS
ORAL_TABLET | ORAL | 0 refills | Status: DC
Start: 2020-04-25 — End: 2020-08-13

## 2020-04-25 NOTE — Discharge Instructions (Addendum)
Continue your usual care, at this time.  The blood in the urine should gradually decrease.  If you develop increasing abdominal pain, fever, chills or other concerns, return for reevaluation.  We are treating you with 3 days of antibiotic, to prevent complications.  Otherwise, follow the plans previously, for starting the antibiotic the day before your CT scan.

## 2020-04-25 NOTE — ED Provider Notes (Addendum)
[facility] EMERGENCY DEPARTMENT Provider Note   CSN: [id number] Arrival date & time: 04/25/20  3016     History Chief Complaint  Patient presents with   catheter problem    Katie Mclaughlin is a 49 y.o. female.  HPI She presents for nondraining Foley catheter.  The catheter was placed, 4 days ago during operative hysterectomy.  Apparently, the uterus was adherent to the bladder and a bladder defect was created as the uterus was removed.  A 2 cm defect of bladder wall was repaired intraoperatively, and she was managed with Foley catheter since then.  Plan was to remove the catheter in 10 to 14 days, after CT cystogram to ensure no bladder leak.  Patient presented to the examination room prior to my arrival on shift.  I saw her after the nurse placed a Foley catheter, ordered by Dr. Regenia Skeeter.  This placement resulted and 1100 cc of urine, into the bag, and resolution of the patient's suprapubic discomfort.      Past Medical History:  Diagnosis Date   Anemia    BRONCHITIS, ACUTE 07/14/2010   Dyspnea    Hypothyroidism     Patient Active Problem List   Diagnosis Date Noted   Intraoperative bladder injury 04/22/2020   Fibroid, uterine 04/21/2020   Vitamin D deficiency 06/09/2019   Anemia associated with acute blood loss 08/14/2018   Submucous uterine fibroid 08/14/2018   Abnormal uterine bleeding 08/14/2018   Menorrhagia 08/13/2018   Low back pain 09/18/2012   Elevated blood pressure 06/24/2011   Well adult exam 03/25/2011   Hyperthyroidism 03/25/2011   Breast discharge 03/25/2011   Migraine headache 03/25/2011   BRONCHITIS, ACUTE 07/14/2010    Past Surgical History:  Procedure Laterality Date   CESAREAN SECTION     CYSTOSCOPY N/A 04/21/2020   Procedure: CYSTOSCOPY;  Surgeon: Waymon Amato, MD;  Location: [facility];  Service: Gynecology;  Laterality: N/A;   FRACTURE SURGERY     nose fracture sx. at 49 years old   HYSTEROSCOPY WITH D &  C N/A 08/14/2018   Procedure: DILATATION AND CURETTAGE /HYSTEROSCOPY;  Surgeon: Eldred Manges, MD;  Location: [facility];  Service: Gynecology;  Laterality: N/A;   LAPAROSCOPIC VAGINAL HYSTERECTOMY WITH SALPINGECTOMY Bilateral 04/21/2020   Procedure: Attempted Laparoscopic Assisted Vaginal Hysterectomy; Abdominal Hysterectomy with Bilateral salpingectomy;  Surgeon: Waymon Amato, MD;  Location: [facility];  Service: Gynecology;  Laterality: Bilateral;   LAPAROTOMY N/A 04/21/2020   Procedure: CYSTORRHAPHY CLOSURE/ BLADDER REPAIR;  Surgeon: Waymon Amato, MD;  Location: [facility];  Service: Gynecology;  Laterality: N/A;   TONSILLECTOMY       OB History    Gravida  1   Para  1   Term  1   Preterm      AB      Living  1     SAB      TAB      Ectopic      Multiple      Live Births  1           Family History  Problem Relation Age of Onset   Diabetes Mother    Migraines Mother    Hypertension Mother    Hyperlipidemia Mother    Diabetes Father    Hypertension Father    Hyperlipidemia Father    Hypertension Brother     Social History   Tobacco Use   Smoking status: Never Smoker   Smokeless tobacco: Never Used  Media planner  Vaping Use: Never used  Substance Use Topics   Alcohol use: No    Comment: seldom use of wine   Drug use: No    Home Medications Prior to Admission medications   Medication Sig Start Date End Date Taking? Authorizing Provider  Cholecalciferol (VITAMIN D3) 125 MCG (5000 UT) TABS Take 5,000 Units by mouth daily.     [provider]  diphenhydrAMINE (BENADRYL) 25 MG tablet Take 25 mg by mouth daily as needed for allergies.    [provider]  ferrous sulfate 325 (65 FE) MG tablet Take 650 mg by mouth daily.     [provider]  ibuprofen (ADVIL) 600 MG tablet take 1 tablet po pc every 6 hours for 5 days then prn-post operative pain 04/23/20   [name], PA-C  Naphazoline HCl (CLEAR EYES OP) Place 1 drop into  both eyes daily as needed (dry eyes).    [provider]  oxybutynin (DITROPAN XL) 5 MG 24 hr tablet take 1 tablet po tid prn for bladder spasms 04/23/20   [name], PA-C  oxyCODONE-acetaminophen (PERCOCET/ROXICET) 5-325 MG tablet take 1-2 tablet po every 6 hours as needed for breakthrough post operative pain 04/23/20   [name], PA-C  rosuvastatin (CRESTOR) 20 MG tablet Take 1 tablet (20 mg total) by mouth daily. 01/02/20 04/08/20  Adrian Prows, MD  sulfamethoxazole-trimethoprim (BACTRIM DS) 800-160 MG tablet take 1 tablet po bid starting the day before catheter removal, until completed 04/23/20   [name], PA-C    Allergies    Patient has no known allergies.  Review of Systems   Review of Systems  All other systems reviewed and are negative.   Physical Exam Updated Vital Signs BP (!) 148/80 (BP Location: Left Arm)    Pulse 83    Temp 98.7 F (37.1 C) (Oral)    Resp (!) 22    SpO2 100%   Physical Exam Vitals and nursing note reviewed.  Constitutional:      Appearance: She is well-developed. She is obese. She is not ill-appearing, toxic-appearing or diaphoretic.  HENT:     Head: Normocephalic and atraumatic.     Right Ear: External ear normal.     Left Ear: External ear normal.  Eyes:     Conjunctiva/sclera: Conjunctivae normal.     Pupils: Pupils are equal, round, and reactive to light.  Neck:     Trachea: Phonation normal.  Cardiovascular:     Rate and Rhythm: Normal rate.  Pulmonary:     Effort: Pulmonary effort is normal.  Abdominal:     Comments: Suprapubic  region with postoperative mesh and occlusive dressing present.  This area is not distended and mildly tender to palpation.  There is no localized drainage, bleeding, swelling, or wound dehiscence.  Genitourinary:    Comments: External GU region is remarkable for presence of foley catheter(placed before I saw patient), and absence of frank bleeding from the external GU region. Musculoskeletal:         General: Normal range of motion.     Cervical back: Normal range of motion and neck supple.  Skin:    General: Skin is warm and dry.  Neurological:     Mental Status: She is alert and oriented to person, place, and time.     Cranial Nerves: No cranial nerve deficit.     Sensory: No sensory deficit.     Motor: No abnormal muscle tone.     Coordination: Coordination normal.  Psychiatric:  Mood and Affect: Mood normal.        Behavior: Behavior normal.        Thought Content: Thought content normal.        Judgment: Judgment normal.     ED Results / Procedures / Treatments   Labs (all labs ordered are listed, but only abnormal results are displayed) Labs Reviewed  URINALYSIS, ROUTINE W REFLEX MICROSCOPIC    EKG None  Radiology No results found.  Procedures Procedures (including critical care time)  Medications Ordered in ED Medications - No data to display  ED Course  I have reviewed the triage vital signs and the nursing notes.  Pertinent labs & imaging results that were available during my care of the patient were reviewed by me and considered in my medical decision making (see chart for details).  Clinical Course as of Apr 25 904  Fri Apr 25, 2020  0905 I discussed case with urology, Dr. Junious Silk regarding the urine retention today.  He agrees with discharge at this time and recommends brief period of antibiotic treatment, because of the catheter replacement today.   [EW]    Clinical Course User Index [EW] Daleen Bo, MD   MDM Rules/Calculators/A&P                           Patient Vitals for the past 24 hrs:  BP Temp Temp src Pulse Resp SpO2  04/25/20 0846 121/73 -- -- 79 18 93 %  04/25/20 0806 -- -- -- -- 18 --  04/25/20 0801 -- -- -- 78 -- 91 %  04/25/20 0800 129/73 -- -- -- -- --  04/25/20 0657 (!) 148/80 98.7 F (37.1 C) Oral 83 (!) 22 100 %    9:07 AM Reevaluation with update and discussion. After initial assessment and treatment, an  updated evaluation reveals Nursing Notes Reviewed/ Care Coordinated Applicable Imaging Reviewed Interpretation of Laboratory Data incorporated into ED treatment   Medical Decision Making:  This patient is presenting for evaluation of nonfunctioning Foley catheter, which does require a range of treatment options, and is a complaint that involves a moderate risk of morbidity and mortality. The differential diagnoses include UTI, bladder malfunction, complication from recent bladder injury. I decided to review old records, and in summary middle-aged female, with recent total hysterectomy, open approach, during which urinary bladder was injured..  I obtained additional historical information from her husband at the bedside.  Clinical Laboratory Tests Ordered, included Urinalysis. Review indicates large amount of blood and few bacteria.     Critical Interventions-clinical evaluation, Foley catheter replacement by nursing staff, reassessment  After These Interventions, the Patient was reevaluated and was found stable for discharge.  Urinary retention likely secondary to debris versus bleeding, improved after replacement of Foley catheter.  Patient given 3 days worth of single dose antibiotic, because of the catheter replacement.  Doubt bladder rupture, or other complications from recent hysterectomy  CRITICAL CARE-no Performed by: Daleen Bo  Nursing Notes Reviewed/ Care Coordinated Applicable Imaging Reviewed Interpretation of Laboratory Data incorporated into ED treatment  The patient appears reasonably screened and/or stabilized for discharge and I doubt any other medical condition or other [facility] requiring further screening, evaluation, or treatment in the ED at this time prior to discharge.  Plan: Home Medications-continue usual; Home Treatments-gradual advance diet and activity; return here if the recommended treatment, does not improve the symptoms; Recommended follow up-as scheduled  for CT imaging, and 10 days  Final Clinical Impression(s) / ED Diagnoses Final diagnoses:  Urinary retention  Hematuria, unspecified type    Rx / DC Orders ED Discharge Orders    None       Daleen Bo, MD 04/25/20 9090    Daleen Bo, MD 05/10/20 [phone]

## 2020-04-25 NOTE — ED Notes (Signed)
Patient verbalizes understanding of discharge instructions. Opportunity for questioning and answers were provided. Pt discharged from ED. 

## 2020-04-25 NOTE — ED Triage Notes (Signed)
Pt present to ED POV. Pt c/o LQ abd pain. Pt reports that she had foley catheter placed Monday. Pt reports that she has not had urine drain from bag since 0000. Pt states that she thinks her bladder is full and it feels like it's going to burst

## 2020-04-30 ENCOUNTER — Other Ambulatory Visit: Payer: Self-pay | Admitting: Cardiology

## 2020-04-30 DIAGNOSIS — E782 Mixed hyperlipidemia: Secondary | ICD-10-CM

## 2020-05-02 ENCOUNTER — Ambulatory Visit (HOSPITAL_COMMUNITY)
Admission: RE | Admit: 2020-05-02 | Discharge: 2020-05-02 | Disposition: A | Discharge status: Home / Self Care | Payer: BC Managed Care – PPO | Source: Ambulatory Visit | Attending: Obstetrics and Gynecology | Admitting: Obstetrics and Gynecology

## 2020-05-02 ENCOUNTER — Other Ambulatory Visit: Payer: Self-pay

## 2020-05-02 DIAGNOSIS — N9981 Other intraoperative complications of genitourinary system: Secondary | ICD-10-CM

## 2020-05-02 DIAGNOSIS — Z435 Encounter for attention to cystostomy: Secondary | ICD-10-CM | POA: Diagnosis not present

## 2020-05-02 MED ORDER — IOTHALAMATE MEGLUMINE 17.2 % UR SOLN
500.0000 mL | Freq: Once | URETHRAL | Status: AC | PRN
  Administered 2020-05-02: 300 mL via INTRAVESICAL

## 2020-05-27 DIAGNOSIS — R102 Pelvic and perineal pain: Secondary | ICD-10-CM | POA: Diagnosis not present

## 2020-05-27 DIAGNOSIS — R3915 Urgency of urination: Secondary | ICD-10-CM | POA: Diagnosis not present

## 2020-05-27 DIAGNOSIS — R350 Frequency of micturition: Secondary | ICD-10-CM | POA: Diagnosis not present

## 2020-05-27 DIAGNOSIS — N390 Urinary tract infection, site not specified: Secondary | ICD-10-CM | POA: Diagnosis not present

## 2020-05-27 DIAGNOSIS — B961 Klebsiella pneumoniae [K. pneumoniae] as the cause of diseases classified elsewhere: Secondary | ICD-10-CM | POA: Diagnosis not present

## 2020-05-27 DIAGNOSIS — S3723XD Laceration of bladder, subsequent encounter: Secondary | ICD-10-CM | POA: Diagnosis not present

## 2020-05-30 ENCOUNTER — Ambulatory Visit: Payer: BC Managed Care – PPO

## 2020-06-02 ENCOUNTER — Other Ambulatory Visit: Payer: Self-pay | Admitting: Obstetrics & Gynecology

## 2020-06-02 DIAGNOSIS — R309 Painful micturition, unspecified: Secondary | ICD-10-CM | POA: Diagnosis not present

## 2020-06-02 DIAGNOSIS — A630 Anogenital (venereal) warts: Secondary | ICD-10-CM | POA: Diagnosis not present

## 2020-06-02 DIAGNOSIS — L989 Disorder of the skin and subcutaneous tissue, unspecified: Secondary | ICD-10-CM | POA: Diagnosis not present

## 2020-06-05 DIAGNOSIS — R102 Pelvic and perineal pain: Secondary | ICD-10-CM | POA: Diagnosis not present

## 2020-06-05 DIAGNOSIS — R350 Frequency of micturition: Secondary | ICD-10-CM | POA: Diagnosis not present

## 2020-06-05 DIAGNOSIS — R3915 Urgency of urination: Secondary | ICD-10-CM | POA: Diagnosis not present

## 2020-06-18 DIAGNOSIS — Z23 Encounter for immunization: Secondary | ICD-10-CM | POA: Diagnosis not present

## 2020-07-10 DIAGNOSIS — R32 Unspecified urinary incontinence: Secondary | ICD-10-CM | POA: Diagnosis not present

## 2020-07-30 DIAGNOSIS — Z23 Encounter for immunization: Secondary | ICD-10-CM | POA: Diagnosis not present

## 2020-08-05 ENCOUNTER — Other Ambulatory Visit: Payer: Self-pay | Admitting: Cardiology

## 2020-08-05 DIAGNOSIS — E782 Mixed hyperlipidemia: Secondary | ICD-10-CM

## 2020-08-13 ENCOUNTER — Encounter: Payer: Self-pay | Admitting: Internal Medicine

## 2020-08-13 ENCOUNTER — Ambulatory Visit (INDEPENDENT_AMBULATORY_CARE_PROVIDER_SITE_OTHER): Payer: BC Managed Care – PPO | Admitting: Internal Medicine

## 2020-08-13 ENCOUNTER — Other Ambulatory Visit: Payer: Self-pay

## 2020-08-13 VITALS — BP 118/82 | HR 80 | Temp 98.8°F | Ht 63.0 in | Wt 188.8 lb

## 2020-08-13 DIAGNOSIS — E782 Mixed hyperlipidemia: Secondary | ICD-10-CM

## 2020-08-13 DIAGNOSIS — Z0000 Encounter for general adult medical examination without abnormal findings: Secondary | ICD-10-CM | POA: Diagnosis not present

## 2020-08-13 DIAGNOSIS — N9981 Other intraoperative complications of genitourinary system: Secondary | ICD-10-CM

## 2020-08-13 DIAGNOSIS — D509 Iron deficiency anemia, unspecified: Secondary | ICD-10-CM | POA: Diagnosis not present

## 2020-08-13 LAB — COMPREHENSIVE METABOLIC PANEL
ALT: 57 U/L — ABNORMAL HIGH (ref 0–35)
AST: 32 U/L (ref 0–37)
Albumin: 4.5 g/dL (ref 3.5–5.2)
Alkaline Phosphatase: 65 U/L (ref 39–117)
BUN: 12 mg/dL (ref 6–23)
CO2: 31 mEq/L (ref 19–32)
Calcium: 9.6 mg/dL (ref 8.4–10.5)
Chloride: 98 mEq/L (ref 96–112)
Creatinine, Ser: 0.6 mg/dL (ref 0.40–1.20)
GFR: 105.92 mL/min (ref >60.00)
Glucose, Bld: 95 mg/dL (ref 70–99)
Potassium: 3.9 mEq/L (ref 3.5–5.1)
Sodium: 137 mEq/L (ref 135–145)
Total Bilirubin: 0.4 mg/dL (ref 0.2–1.2)
Total Protein: 7.5 g/dL (ref 6.0–8.3)

## 2020-08-13 LAB — LIPID PANEL
Cholesterol: 186 mg/dL (ref 0–200)
HDL: 39 mg/dL — ABNORMAL LOW (ref >39.00)
NonHDL: 147.19
Total CHOL/HDL Ratio: 5
Triglycerides: 205 mg/dL — ABNORMAL HIGH (ref 0.0–149.0)
VLDL: 41 mg/dL — ABNORMAL HIGH (ref 0.0–40.0)

## 2020-08-13 LAB — URINALYSIS
Bilirubin Urine: NEGATIVE
Hgb urine dipstick: NEGATIVE
Ketones, ur: NEGATIVE
Leukocytes,Ua: NEGATIVE
Nitrite: NEGATIVE
Specific Gravity, Urine: 1.02 (ref 1.000–1.030)
Total Protein, Urine: NEGATIVE
Urine Glucose: NEGATIVE
Urobilinogen, UA: 0.2 (ref 0.0–1.0)
pH: 8 (ref 5.0–8.0)

## 2020-08-13 LAB — CBC WITH DIFFERENTIAL/PLATELET
Basophils Absolute: 0 10*3/uL (ref 0.0–0.1)
Basophils Relative: 0.7 % (ref 0.0–3.0)
Eosinophils Absolute: 0.2 10*3/uL (ref 0.0–0.7)
Eosinophils Relative: 3 % (ref 0.0–5.0)
HCT: 41.6 % (ref 36.0–46.0)
Hemoglobin: 13.7 g/dL (ref 12.0–15.0)
Lymphocytes Relative: 28.2 % (ref 12.0–46.0)
Lymphs Abs: 1.5 10*3/uL (ref 0.7–4.0)
MCHC: 33 g/dL (ref 30.0–36.0)
MCV: 86.5 fl (ref 78.0–100.0)
Monocytes Absolute: 0.4 10*3/uL (ref 0.1–1.0)
Monocytes Relative: 7.1 % (ref 3.0–12.0)
Neutro Abs: 3.2 10*3/uL (ref 1.4–7.7)
Neutrophils Relative %: 61 % (ref 43.0–77.0)
Platelets: 205 10*3/uL (ref 150.0–400.0)
RBC: 4.81 Mil/uL (ref 3.87–5.11)
RDW: 14.8 % (ref 11.5–15.5)
WBC: 5.3 10*3/uL (ref 4.0–10.5)

## 2020-08-13 LAB — LDL CHOLESTEROL, DIRECT: Direct LDL: 120 mg/dL

## 2020-08-13 LAB — TSH: TSH: 0.93 u[IU]/mL (ref 0.35–4.50)

## 2020-08-13 MED ORDER — ROSUVASTATIN CALCIUM 20 MG PO TABS: 20.0000 mg | ORAL_TABLET | Freq: Every day | ORAL | 2 refills | Status: DC

## 2020-08-13 NOTE — Patient Instructions (Addendum)
   Robley Fries, MD Undergraduate Education:  [employer]  Specialties/ Area of Interest:  General Urology    Nickel allergy, catgut sutures

## 2020-08-13 NOTE — Assessment & Plan Note (Signed)

## 2020-08-13 NOTE — Progress Notes (Signed)
Subjective:  Patient ID: Katie Mclaughlin, female    DOB: [birth year]  Age: 49 y.o. MRN: [id number]  CC: Annual Exam   HPI Katie Mclaughlin presents for a well exam C/o urinary incontinence, anemia C/o suture inflammation   Outpatient Medications Prior to Visit  Medication Sig Dispense Refill  . Cholecalciferol (VITAMIN D3) 125 MCG (5000 UT) TABS Take 5,000 Units by mouth daily.     . ferrous sulfate 325 (65 FE) MG tablet Take 650 mg by mouth daily.     Marland Kitchen ibuprofen (ADVIL) 600 MG tablet take 1 tablet po pc every 6 hours for 5 days then prn-post operative pain 30 tablet 1  . Naphazoline HCl (CLEAR EYES OP) Place 1 drop into both eyes daily as needed (dry eyes).    Marland Kitchen oxybutynin (DITROPAN XL) 5 MG 24 hr tablet take 1 tablet po tid prn for bladder spasms 21 tablet 0  . oxyCODONE-acetaminophen (PERCOCET/ROXICET) 5-325 MG tablet take 1-2 tablet po every 6 hours as needed for breakthrough post operative pain 28 tablet 0  . rosuvastatin (CRESTOR) 20 MG tablet TAKE ONE TABLET BY MOUTH DAILY 30 tablet 2  . diphenhydrAMINE (BENADRYL) 25 MG tablet Take 25 mg by mouth daily as needed for allergies. (Patient not taking: Reported on 08/13/2020)    . sulfamethoxazole-trimethoprim (BACTRIM DS) 800-160 MG tablet q day, om 8/7 and 8/8 (Patient not taking: Reported on 08/13/2020) 2 tablet 0   No facility-administered medications prior to visit.    ROS: Review of Systems  Constitutional: Negative for activity change, appetite change, chills, fatigue and unexpected weight change.  HENT: Negative for congestion, mouth sores and sinus pressure.   Eyes: Negative for visual disturbance.  Respiratory: Negative for cough and chest tightness.   Gastrointestinal: Negative for abdominal pain and nausea.  Genitourinary: Positive for urgency. Negative for difficulty urinating, frequency and vaginal pain.  Musculoskeletal: Negative for back pain and gait problem.  Skin: Positive for color change and wound. Negative  for pallor and rash.  Neurological: Negative for dizziness, tremors, weakness, numbness and headaches.  Psychiatric/Behavioral: Negative for confusion and sleep disturbance.   Urinary incontinence Objective:  BP 118/82 (BP Location: Left Arm)   Pulse 80   Temp 98.8 F (37.1 C) (Oral)   Ht 5\' 3"  (1.6 m)   Wt 188 lb 12.8 oz (85.6 kg)   SpO2 98%   BMI 33.44 kg/m   BP Readings from Last 3 Encounters:  08/13/20 118/82  04/25/20 121/73  04/23/20 105/65    Wt Readings from Last 3 Encounters:  08/13/20 188 lb 12.8 oz (85.6 kg)  04/21/20 190 lb (86.2 kg)  04/18/20 190 lb 1 oz (86.2 kg)    Physical Exam Constitutional:      General: She is not in acute distress.    Appearance: She is well-developed. She is obese.  HENT:     Head: Normocephalic.     Right Ear: External ear normal.     Left Ear: External ear normal.     Nose: Nose normal.  Eyes:     General:        Right eye: No discharge.        Left eye: No discharge.     Conjunctiva/sclera: Conjunctivae normal.     Pupils: Pupils are equal, round, and reactive to light.  Neck:     Thyroid: No thyromegaly.     Vascular: No JVD.     Trachea: No tracheal deviation.  Cardiovascular:     Rate and  Rhythm: Normal rate and regular rhythm.     Heart sounds: Normal heart sounds.  Pulmonary:     Effort: No respiratory distress.     Breath sounds: No stridor. No wheezing.  Abdominal:     General: Bowel sounds are normal. There is no distension.     Palpations: Abdomen is soft. There is no mass.     Tenderness: There is no abdominal tenderness. There is no guarding or rebound.  Musculoskeletal:        General: No tenderness.     Cervical back: Normal range of motion and neck supple.  Lymphadenopathy:     Cervical: No cervical adenopathy.  Skin:    Findings: No erythema or rash.  Neurological:     Cranial Nerves: No cranial nerve deficit.     Motor: No abnormal muscle tone.     Coordination: Coordination normal.     Deep  Tendon Reflexes: Reflexes normal.  Psychiatric:        Behavior: Behavior normal.        Thought Content: Thought content normal.        Judgment: Judgment normal.     Lab Results  Component Value Date   WBC 14.5 (H) 04/22/2020   HGB 10.7 (L) 04/22/2020   HCT 33.8 (L) 04/22/2020   PLT 202 04/22/2020   GLUCOSE 157 (H) 04/22/2020   CHOL 227 (H) 06/07/2019   TRIG 193.0 (H) 06/07/2019   HDL 33.30 (L) 06/07/2019   LDLDIRECT 184.8 09/18/2012   LDLCALC 155 (H) 06/07/2019   ALT 19 06/07/2019   AST 16 06/07/2019   NA 134 (L) 04/22/2020   K 3.9 04/22/2020   CL 101 04/22/2020   CREATININE 0.58 04/22/2020   BUN 5 (L) 04/22/2020   CO2 22 04/22/2020   TSH 0.89 06/07/2019    DG Cystogram  Result Date: 05/02/2020 CLINICAL DATA:  Initial laparoscopic vaginal hysterectomy and salpingectomy converted to an abdominal hysterectomy and bilateral salpingectomy due to bleeding during the procedure. Scar tissue was encountered in the bladder with adherence of the bladder to the uterus, requiring a cystotomy. The cystotomy was subsequently repaired. The surgery was performed on 04/21/2020. EXAM: CYSTOGRAM TECHNIQUE: After catheterization of the urinary bladder following sterile technique the bladder was filled with mL Cysto-Hypaque 30% by drip infusion. Serial spot images were obtained during bladder filling and post draining. FLUOROSCOPY TIME:  Fluoroscopy Time:  4 minutes 0 seconds Radiation Exposure Index (if provided by the fluoroscopic device): 50.1 mGy Number of Acquired Spot Images: 4 COMPARISON:  None. FINDINGS: The preliminary supine radiograph of the abdomen demonstrates a normal bowel gas pattern and mild lumbar spine degenerative changes. The urinary bladder was filled in a retrograde fashion with 300 cc of Cysto-Conray through an indwelling Foley catheter. There is mild posterior bladder wall irregularity inferiorly, compatible with the site of recent surgical closure. The remainder of the  urinary bladder has a normal appearance. No contrast extravasation was seen. The bladder emptied normally through the Foley catheter with no significant postvoid residual. IMPRESSION: Expected postoperative appearance of the urinary bladder with no contrast extravasation. Electronically Signed   By: Claudie Revering M.D.   On: 05/02/2020 11:32    Assessment & Plan:    Walker Kehr, MD

## 2020-08-14 LAB — IRON,TIBC AND FERRITIN PANEL
%SAT: 29 % (calc) (ref 16–45)
Ferritin: 25 ng/mL (ref 16–232)
Iron: 114 ug/dL (ref 40–190)
TIBC: 396 mcg/dL (calc) (ref 250–450)

## 2020-08-17 ENCOUNTER — Encounter: Payer: Self-pay | Admitting: Internal Medicine

## 2020-08-17 NOTE — Assessment & Plan Note (Signed)
Postop by urinary incontinence-getting better

## 2020-08-19 DIAGNOSIS — A630 Anogenital (venereal) warts: Secondary | ICD-10-CM | POA: Diagnosis not present

## 2020-08-19 DIAGNOSIS — N3946 Mixed incontinence: Secondary | ICD-10-CM | POA: Diagnosis not present

## 2020-08-19 DIAGNOSIS — T8189XD Other complications of procedures, not elsewhere classified, subsequent encounter: Secondary | ICD-10-CM | POA: Diagnosis not present

## 2020-10-01 ENCOUNTER — Other Ambulatory Visit: Payer: Self-pay | Admitting: Family

## 2020-10-01 ENCOUNTER — Telehealth (INDEPENDENT_AMBULATORY_CARE_PROVIDER_SITE_OTHER): Payer: BC Managed Care – PPO | Admitting: Family

## 2020-10-01 DIAGNOSIS — J019 Acute sinusitis, unspecified: Secondary | ICD-10-CM

## 2020-10-01 DIAGNOSIS — Z20822 Contact with and (suspected) exposure to covid-19: Secondary | ICD-10-CM | POA: Diagnosis not present

## 2020-10-01 MED ORDER — AMOXICILLIN-POT CLAVULANATE 875-125 MG PO TABS: 1.0000 | ORAL_TABLET | Freq: Two times a day (BID) | ORAL | 0 refills | Status: AC

## 2020-10-01 NOTE — Progress Notes (Signed)
Katie Mclaughlin is a 50 y.o. female with the following history as recorded in Epic[employer]:  Patient Active Problem List   Diagnosis Date Noted  . Intraoperative bladder injury 04/22/2020  . Fibroid, uterine 04/21/2020  . Vitamin D deficiency 06/09/2019  . Anemia associated with acute blood loss 08/14/2018  . Submucous uterine fibroid 08/14/2018  . Abnormal uterine bleeding 08/14/2018  . Menorrhagia 08/13/2018  . Low back pain 09/18/2012  . Elevated blood pressure 06/24/2011  . Well adult exam 03/25/2011  . Hyperthyroidism 03/25/2011  . Breast discharge 03/25/2011  . Migraine headache 03/25/2011  . BRONCHITIS, ACUTE 07/14/2010    Current Outpatient Medications  Medication Sig Dispense Refill  . amoxicillin-clavulanate (AUGMENTIN) 875-125 MG tablet Take 1 tablet by mouth 2 (two) times daily for 10 days. 20 tablet 0  . Cholecalciferol (VITAMIN D3) 125 MCG (5000 UT) TABS Take 5,000 Units by mouth daily.     . ferrous sulfate 325 (65 FE) MG tablet Take 650 mg by mouth daily.     Marland Kitchen ibuprofen (ADVIL) 600 MG tablet take 1 tablet po pc every 6 hours for 5 days then prn-post operative pain 30 tablet 1  . Naphazoline HCl (CLEAR EYES OP) Place 1 drop into both eyes daily as needed (dry eyes).    Marland Kitchen oxybutynin (DITROPAN XL) 5 MG 24 hr tablet take 1 tablet po tid prn for bladder spasms 21 tablet 0  . oxyCODONE-acetaminophen (PERCOCET/ROXICET) 5-325 MG tablet take 1-2 tablet po every 6 hours as needed for breakthrough post operative pain 28 tablet 0  . rosuvastatin (CRESTOR) 20 MG tablet Take 1 tablet (20 mg total) by mouth daily. 30 tablet 2   No current facility-administered medications for this visit.    Allergies: Nickel  Past Medical History:  Diagnosis Date  . Anemia   . BRONCHITIS, ACUTE 07/14/2010  . Dyspnea   . Hypothyroidism     Past Surgical History:  Procedure Laterality Date  . CESAREAN SECTION    . CYSTOSCOPY N/A 04/21/2020   Procedure: CYSTOSCOPY;  Surgeon: Waymon Amato, MD;   Location: [facility];  Service: Gynecology;  Laterality: N/A;  . FRACTURE SURGERY     nose fracture sx. at 50 years old  . HYSTEROSCOPY WITH D & C N/A 08/14/2018   Procedure: DILATATION AND CURETTAGE /HYSTEROSCOPY;  Surgeon: Eldred Manges, MD;  Location: [facility];  Service: Gynecology;  Laterality: N/A;  . LAPAROSCOPIC VAGINAL HYSTERECTOMY WITH SALPINGECTOMY Bilateral 04/21/2020   Procedure: Attempted Laparoscopic Assisted Vaginal Hysterectomy; Abdominal Hysterectomy with Bilateral salpingectomy;  Surgeon: Waymon Amato, MD;  Location: [facility];  Service: Gynecology;  Laterality: Bilateral;  . LAPAROTOMY N/A 04/21/2020   Procedure: CYSTORRHAPHY CLOSURE/ BLADDER REPAIR;  Surgeon: Waymon Amato, MD;  Location: [facility];  Service: Gynecology;  Laterality: N/A;  . TONSILLECTOMY      Family History  Problem Relation Age of Onset  . Diabetes Mother   . Migraines Mother   . Hypertension Mother   . Hyperlipidemia Mother   . Diabetes Father   . Hypertension Father   . Hyperlipidemia Father   . Hypertension Brother     Social History   Tobacco Use  . Smoking status: Never Smoker  . Smokeless tobacco: Never Used  Substance Use Topics  . Alcohol use: No    Comment: seldom use of wine    Subjective:   I connected with Katie Mclaughlin on 10/01/20 at 12:20 PM EST by a video enabled telemedicine application and verified that I am speaking with the correct  person using two identifiers.   I discussed the limitations of evaluation and management by telemedicine and the availability of in person appointments. The patient expressed understanding and agreed to proceed. Provider in office/ patient is at home; provider, patient's husband and patient are only 3 people on video call.   Suspected COVID + as of 09/20/2020; now experiencing facial pain/ pressure and concerned for sinus infection; taking Advil and Robitussin; requesting work note- hopes to go back to work on Saturday;     Objective:  There were no vitals  filed for this visit.  General: Well developed, well nourished, in no acute distress  Head: Normocephalic and atraumatic  Lungs: Respirations unlabored;  Neurologic: Alert and oriented; speech intact; face symmetrical;   Assessment:  1. Acute sinusitis, recurrence not specified, unspecified location   2. Suspected COVID-19 virus infection     Plan:  Will start Rx for Augmentin 875 mg bid x 10 days; Agree with assumption of + COVID based on family member's diagnosis- she will plan to return to work on 10/04/20 assuming she is feeling better.  No follow-ups on file.  No orders of the defined types were placed in this encounter.   Requested Prescriptions   Signed Prescriptions Disp Refills  . amoxicillin-clavulanate (AUGMENTIN) 875-125 MG tablet 20 tablet 0    Sig: Take 1 tablet by mouth 2 (two) times daily for 10 days.

## 2020-10-16 ENCOUNTER — Other Ambulatory Visit: Payer: Self-pay | Admitting: Family

## 2020-10-16 ENCOUNTER — Encounter: Payer: Self-pay | Admitting: Family

## 2020-10-16 DIAGNOSIS — R053 Chronic cough: Secondary | ICD-10-CM

## 2020-10-17 ENCOUNTER — Ambulatory Visit (INDEPENDENT_AMBULATORY_CARE_PROVIDER_SITE_OTHER)
Admission: RE | Admit: 2020-10-17 | Discharge: 2020-10-17 | Disposition: A | Discharge status: Home / Self Care | Payer: BC Managed Care – PPO | Source: Ambulatory Visit | Attending: Family | Admitting: Family

## 2020-10-17 ENCOUNTER — Other Ambulatory Visit: Payer: Self-pay

## 2020-10-17 DIAGNOSIS — R0602 Shortness of breath: Secondary | ICD-10-CM | POA: Diagnosis not present

## 2020-10-17 DIAGNOSIS — R059 Cough, unspecified: Secondary | ICD-10-CM | POA: Diagnosis not present

## 2020-10-17 DIAGNOSIS — R053 Chronic cough: Secondary | ICD-10-CM

## 2020-10-23 DIAGNOSIS — B961 Klebsiella pneumoniae [K. pneumoniae] as the cause of diseases classified elsewhere: Secondary | ICD-10-CM | POA: Diagnosis not present

## 2020-10-23 DIAGNOSIS — R350 Frequency of micturition: Secondary | ICD-10-CM | POA: Diagnosis not present

## 2020-10-23 DIAGNOSIS — N390 Urinary tract infection, site not specified: Secondary | ICD-10-CM | POA: Diagnosis not present

## 2020-10-23 DIAGNOSIS — R351 Nocturia: Secondary | ICD-10-CM | POA: Diagnosis not present

## 2020-10-23 DIAGNOSIS — N3944 Nocturnal enuresis: Secondary | ICD-10-CM | POA: Diagnosis not present

## 2020-10-23 DIAGNOSIS — N3946 Mixed incontinence: Secondary | ICD-10-CM | POA: Diagnosis not present

## 2020-10-27 ENCOUNTER — Telehealth: Payer: Self-pay

## 2020-10-27 DIAGNOSIS — Z01411 Encounter for gynecological examination (general) (routine) with abnormal findings: Secondary | ICD-10-CM | POA: Diagnosis not present

## 2020-10-27 DIAGNOSIS — A630 Anogenital (venereal) warts: Secondary | ICD-10-CM | POA: Diagnosis not present

## 2020-10-27 DIAGNOSIS — Z6835 Body mass index (BMI) 35.0-35.9, adult: Secondary | ICD-10-CM | POA: Diagnosis not present

## 2020-10-27 DIAGNOSIS — Z1231 Encounter for screening mammogram for malignant neoplasm of breast: Secondary | ICD-10-CM | POA: Diagnosis not present

## 2020-10-27 DIAGNOSIS — N3946 Mixed incontinence: Secondary | ICD-10-CM | POA: Diagnosis not present

## 2020-10-27 NOTE — Telephone Encounter (Signed)
-----   Message from Marrian Salvage, [place] sent at 10/27/2020  9:07 AM EST ----- Did not look at [place]- please let know that CXR is clear; needs to see Dr. Alain Marion if still coughing.

## 2020-11-03 NOTE — Telephone Encounter (Signed)
Pt notified of CXR & denies cough.  No ques/concerns at this time.

## 2020-12-25 DIAGNOSIS — R350 Frequency of micturition: Secondary | ICD-10-CM | POA: Diagnosis not present

## 2020-12-25 DIAGNOSIS — N3946 Mixed incontinence: Secondary | ICD-10-CM | POA: Diagnosis not present

## 2021-06-19 IMAGING — RF DG CYSTOGRAM 3+V
13 of 16 series · 14 of 17 positions shown · non-contrast
Comparison: None.

CLINICAL DATA: Initial laparoscopic vaginal hysterectomy and
salpingectomy converted to an abdominal hysterectomy and bilateral
salpingectomy due to bleeding during the procedure. Scar tissue was
encountered in the bladder with adherence of the bladder to the
uterus, requiring a cystotomy. The cystotomy was subsequently
repaired. The surgery was performed on 04/21/2020.

EXAM:
CYSTOGRAM
TECHNIQUE: After catheterization of the urinary bladder following sterile
technique the bladder was filled with mL Cysto-Hypaque 30% by drip
infusion. Serial spot images were obtained during bladder filling
and post draining.
FLUOROSCOPY TIME:  Fluoroscopy Time:  4 minutes 0 seconds
Radiation Exposure Index (if provided by the fluoroscopic device):
50.1 mGy
Number of Acquired Spot Images: 4

[Series 1: t abdomen supine · 0.15mm/px · 1 of 1 slices shown]
[im 1/1]
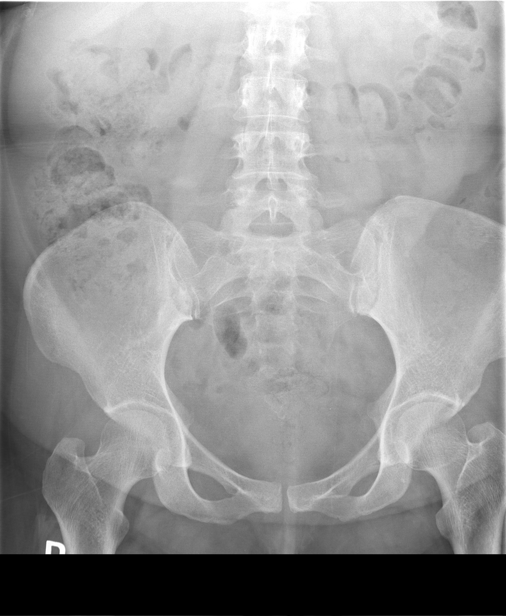

[Series 2: cp_standard · 0.30mm/px · 1 of 1 slices shown (1 of 9)]
[im 1/1]
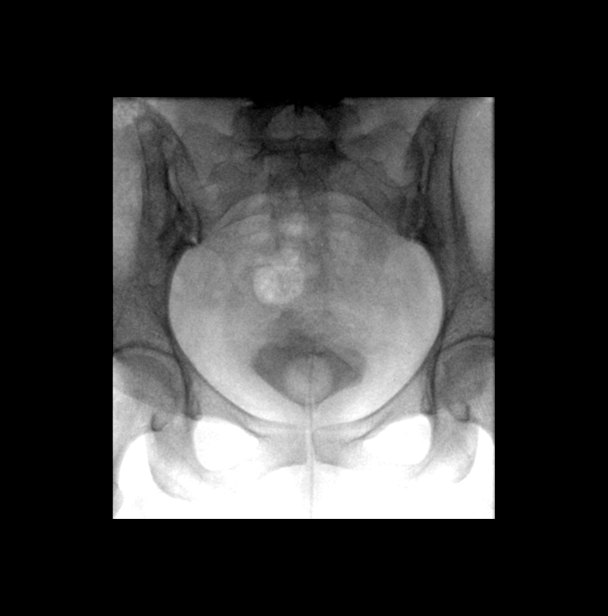

[Series 4: cp_standard · 0.29mm/px · 1 of 1 slices shown (2 of 9)]
[im 1/1]
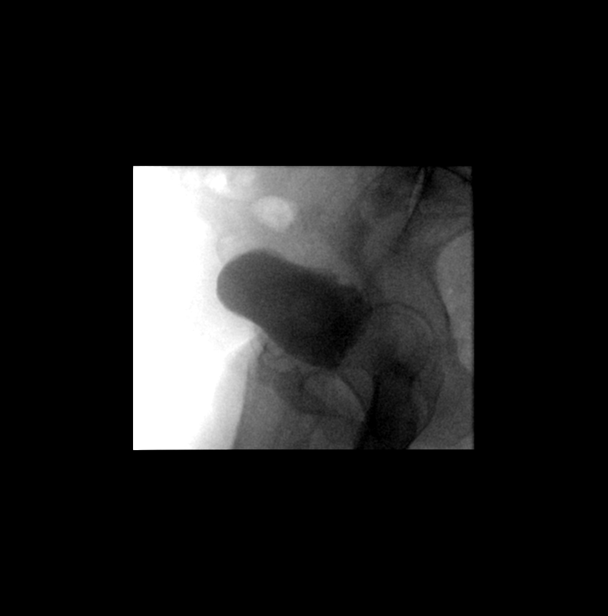

[Series 5: cp_standard · 0.29mm/px · 1 of 1 slices shown (3 of 9)]
[im 1/1]
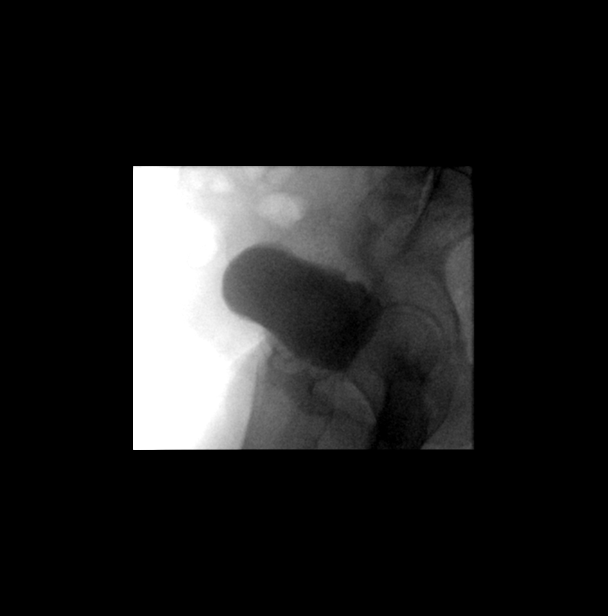

[Series 6: fluoro_barium 2fps_bw · 0.19mm/px · 2 of 2 frames shown (1 of 3)]
[frame 1/2]
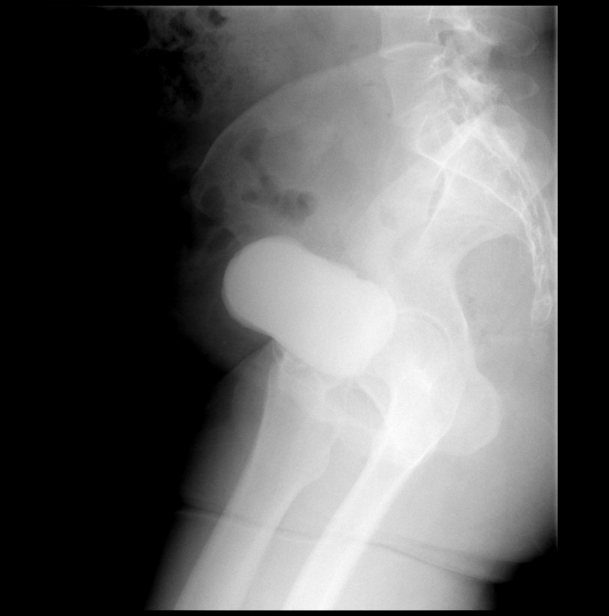
[frame 2/2]
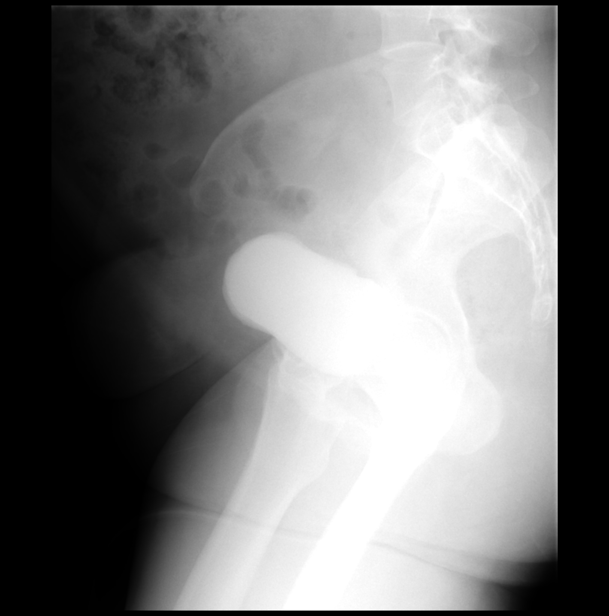

[Series 7: cp_standard · 0.29mm/px · 1 of 1 slices shown (4 of 9)]
[im 1/1]
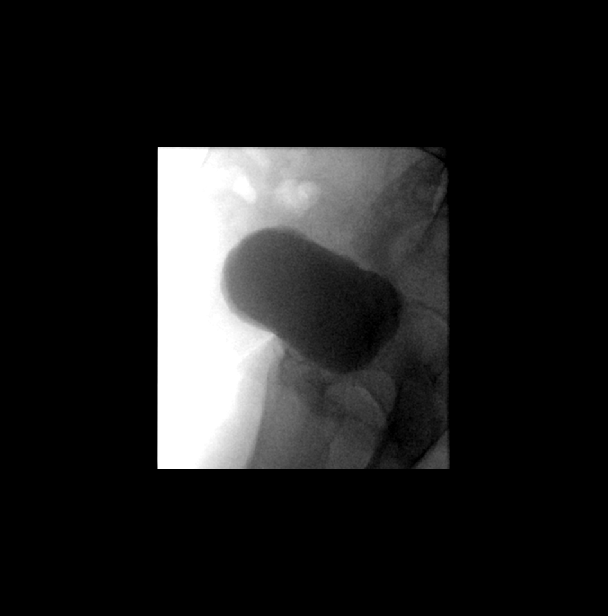

[Series 9: cp_standard · 0.29mm/px · 1 of 1 slices shown (5 of 9)]
[im 1/1]
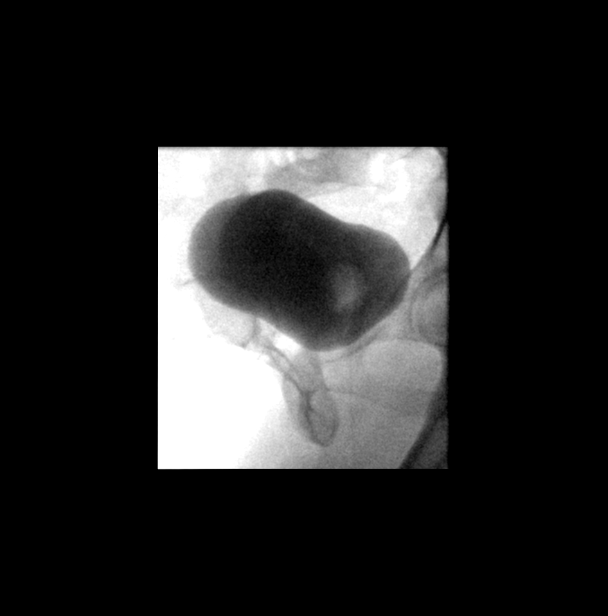

[Series 10: cp_standard · 0.29mm/px · 1 of 1 slices shown (6 of 9)]
[im 1/1]
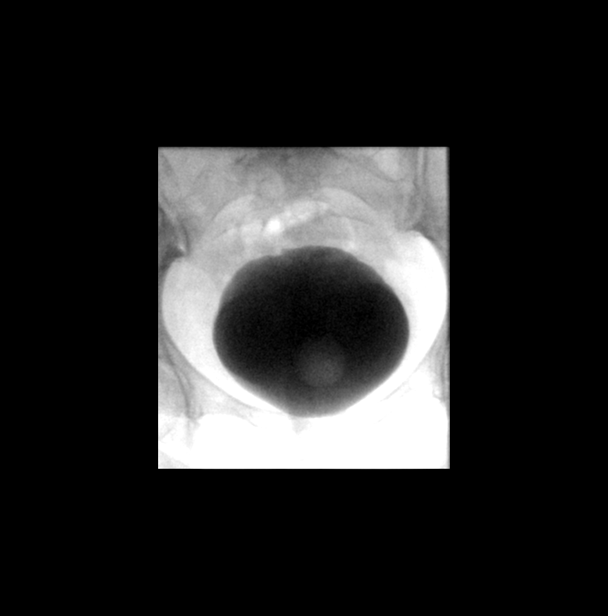

[Series 11: cp_standard · 0.29mm/px · 1 of 1 slices shown (7 of 9)]
[im 1/1]
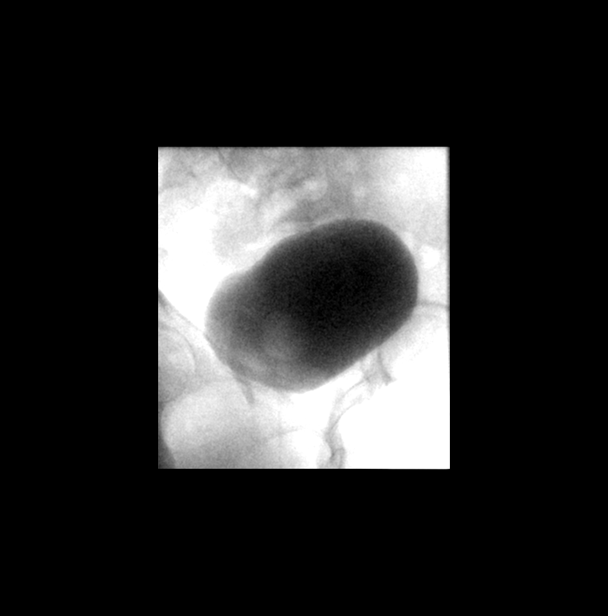

[Series 12: cp_standard · 0.29mm/px · 1 of 1 slices shown (8 of 9)]
[im 1/1]
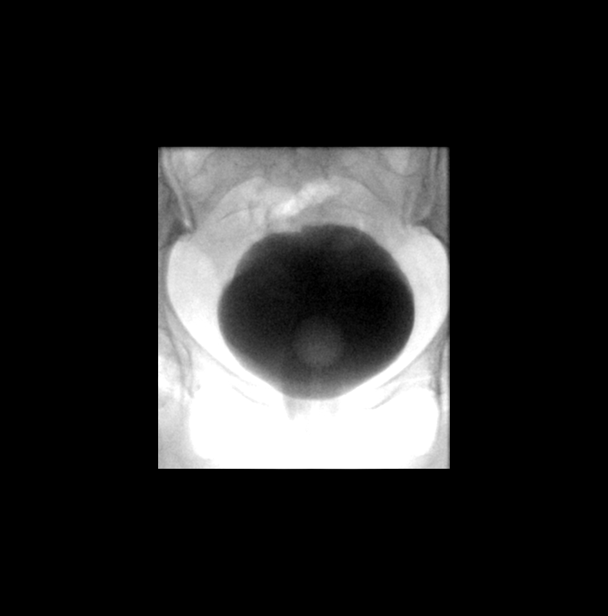

[Series 13: cp_standard · 0.29mm/px · 1 of 1 slices shown (9 of 9)]
[im 1/1]
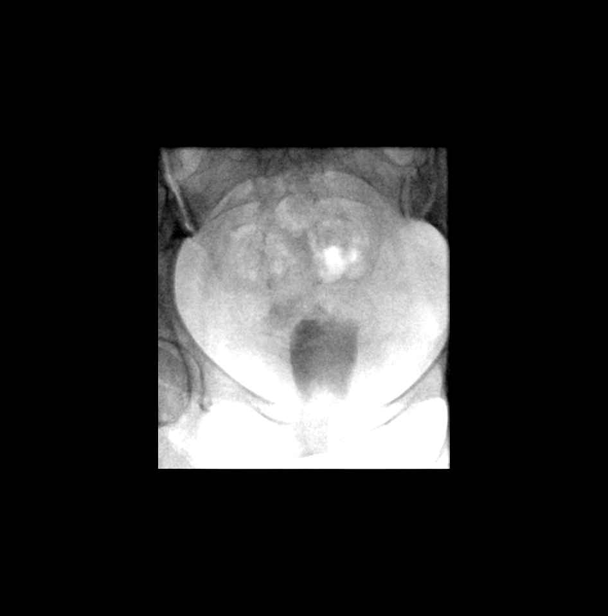

[Series 15: fluoro_barium 2fps_bw · 0.19mm/px · 1 of 1 slices shown (2 of 3)]
[im 1/1]
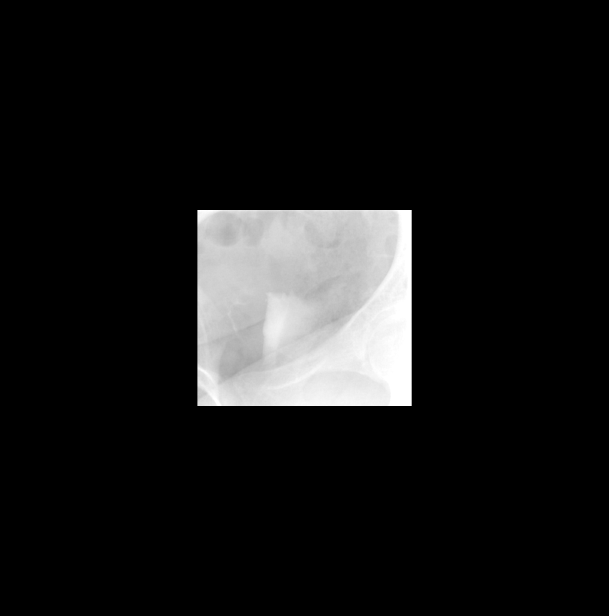

[Series 16: fluoro_barium 2fps_bw · 0.19mm/px · 1 of 1 slices shown (3 of 3)]
[im 1/1]
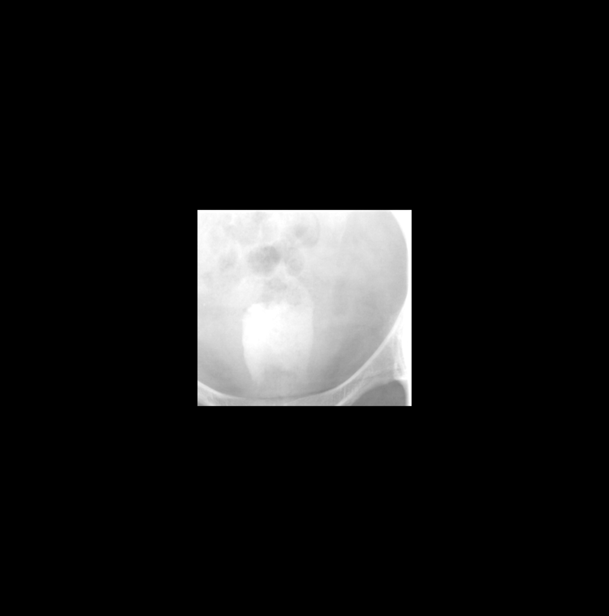

[14 of 17 positions shown; findings below may reference images not displayed]

FINDINGS: The preliminary supine radiograph of the abdomen demonstrates a
normal bowel gas pattern and mild lumbar spine degenerative changes.

The urinary bladder was filled in a retrograde fashion with 300 cc
of [place]aux through an indwelling Foley catheter. There is mild
posterior bladder wall irregularity inferiorly, compatible with the
site of recent surgical closure. The remainder of the urinary
bladder has a normal appearance. No contrast extravasation was seen.
The bladder emptied normally through the Foley catheter with no
significant postvoid residual.
IMPRESSION: Expected postoperative appearance of the urinary bladder with no
contrast extravasation.

## 2021-08-17 DIAGNOSIS — R519 Headache, unspecified: Secondary | ICD-10-CM | POA: Diagnosis not present

## 2021-08-17 DIAGNOSIS — R059 Cough, unspecified: Secondary | ICD-10-CM | POA: Diagnosis not present

## 2021-08-17 DIAGNOSIS — Z20822 Contact with and (suspected) exposure to covid-19: Secondary | ICD-10-CM | POA: Diagnosis not present

## 2021-08-17 DIAGNOSIS — J069 Acute upper respiratory infection, unspecified: Secondary | ICD-10-CM | POA: Diagnosis not present

## 2021-12-02 DIAGNOSIS — Z20822 Contact with and (suspected) exposure to covid-19: Secondary | ICD-10-CM | POA: Diagnosis not present

## 2021-12-04 IMAGING — DX DG CHEST 2V
2 series · 2 of 2 positions shown · non-contrast
Comparison: None

CLINICAL DATA: GTDJA-LA positive 3 weeks ago, cough and shortness
of breath remain

EXAM:
CHEST - 2 VIEW

[chest pa]
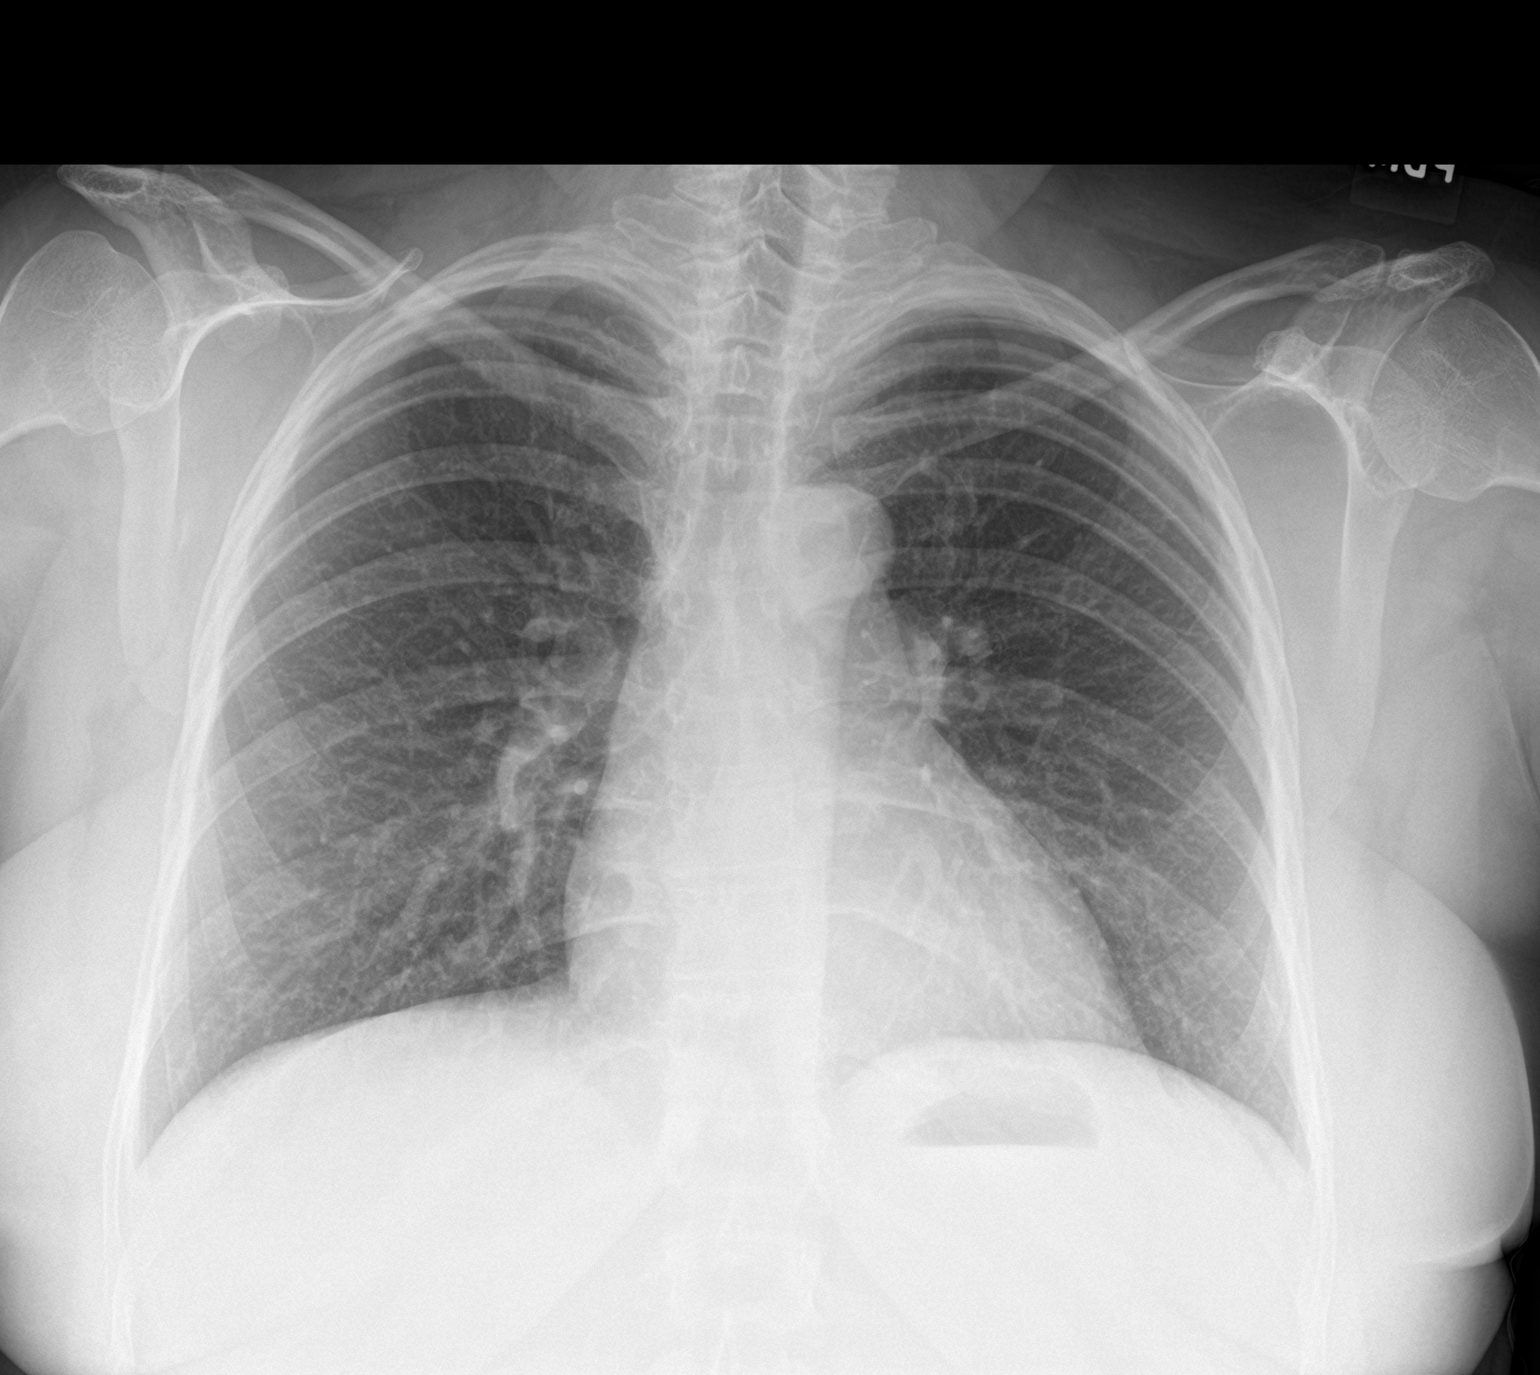

[chest lat]
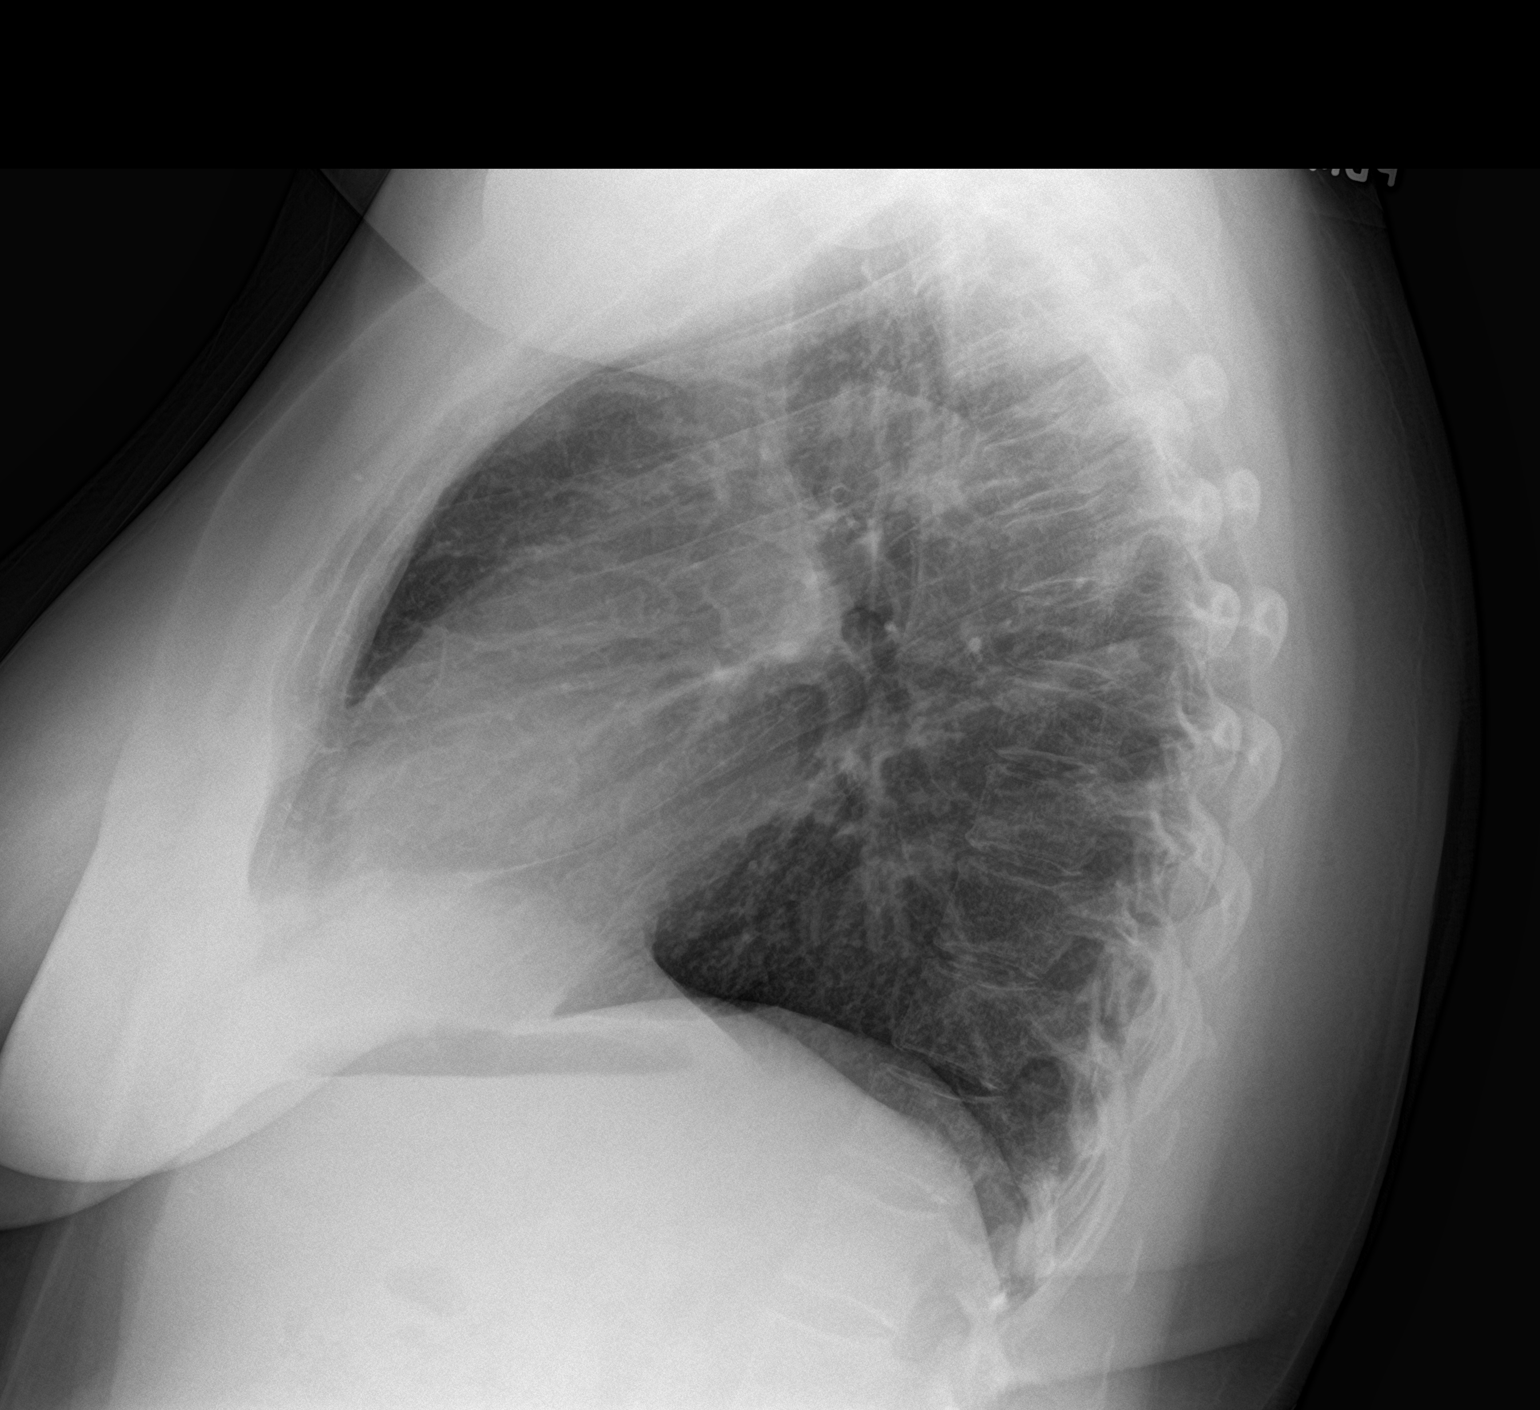

[2 of 2 positions shown; findings below may reference images not displayed]

FINDINGS: Normal heart size, mediastinal contours, and pulmonary vascularity.

Lungs clear.

No pleural effusion or pneumothorax.

Bones unremarkable.
IMPRESSION: Normal exam.

## 2021-12-24 DIAGNOSIS — R0981 Nasal congestion: Secondary | ICD-10-CM | POA: Diagnosis not present

## 2021-12-24 DIAGNOSIS — R030 Elevated blood-pressure reading, without diagnosis of hypertension: Secondary | ICD-10-CM | POA: Diagnosis not present

## 2021-12-24 DIAGNOSIS — J019 Acute sinusitis, unspecified: Secondary | ICD-10-CM | POA: Diagnosis not present

## 2022-04-22 DIAGNOSIS — R21 Rash and other nonspecific skin eruption: Secondary | ICD-10-CM | POA: Diagnosis not present

## 2022-05-06 ENCOUNTER — Ambulatory Visit (INDEPENDENT_AMBULATORY_CARE_PROVIDER_SITE_OTHER): Payer: BC Managed Care – PPO | Admitting: Internal Medicine

## 2022-05-06 ENCOUNTER — Encounter: Payer: Self-pay | Admitting: Internal Medicine

## 2022-05-06 VITALS — BP 130/82 | HR 95 | Temp 99.3°F | Ht 63.0 in | Wt 198.0 lb

## 2022-05-06 DIAGNOSIS — Z0000 Encounter for general adult medical examination without abnormal findings: Secondary | ICD-10-CM

## 2022-05-06 DIAGNOSIS — E782 Mixed hyperlipidemia: Secondary | ICD-10-CM | POA: Diagnosis not present

## 2022-05-06 DIAGNOSIS — M419 Scoliosis, unspecified: Secondary | ICD-10-CM | POA: Insufficient documentation

## 2022-05-06 DIAGNOSIS — E0590 Thyrotoxicosis, unspecified without thyrotoxic crisis or storm: Secondary | ICD-10-CM

## 2022-05-06 DIAGNOSIS — Z862 Personal history of diseases of the blood and blood-forming organs and certain disorders involving the immune mechanism: Secondary | ICD-10-CM | POA: Insufficient documentation

## 2022-05-06 DIAGNOSIS — M2550 Pain in unspecified joint: Secondary | ICD-10-CM

## 2022-05-06 DIAGNOSIS — N393 Stress incontinence (female) (male): Secondary | ICD-10-CM | POA: Insufficient documentation

## 2022-05-06 DIAGNOSIS — E559 Vitamin D deficiency, unspecified: Secondary | ICD-10-CM | POA: Diagnosis not present

## 2022-05-06 DIAGNOSIS — R220 Localized swelling, mass and lump, head: Secondary | ICD-10-CM | POA: Insufficient documentation

## 2022-05-06 DIAGNOSIS — N12 Tubulo-interstitial nephritis, not specified as acute or chronic: Secondary | ICD-10-CM | POA: Insufficient documentation

## 2022-05-06 DIAGNOSIS — N852 Hypertrophy of uterus: Secondary | ICD-10-CM | POA: Insufficient documentation

## 2022-05-06 DIAGNOSIS — E282 Polycystic ovarian syndrome: Secondary | ICD-10-CM | POA: Insufficient documentation

## 2022-05-06 DIAGNOSIS — R140 Abdominal distension (gaseous): Secondary | ICD-10-CM | POA: Insufficient documentation

## 2022-05-06 MED ORDER — TRIAMCINOLONE ACETONIDE 0.5 % EX CREA: 1.0000 | TOPICAL_CREAM | Freq: Three times a day (TID) | CUTANEOUS | 1 refills | Status: AC

## 2022-05-06 MED ORDER — ROSUVASTATIN CALCIUM 10 MG PO TABS: 10.0000 mg | ORAL_TABLET | Freq: Every day | ORAL | 3 refills | Status: DC

## 2022-05-06 MED ORDER — METHYLPREDNISOLONE 4 MG PO TBPK: ORAL_TABLET | ORAL | 0 refills | Status: DC

## 2022-05-06 MED ORDER — HYDROXYZINE PAMOATE 25 MG PO CAPS: 25.0000 mg | ORAL_CAPSULE | Freq: Three times a day (TID) | ORAL | 1 refills | Status: DC | PRN

## 2022-05-06 MED ORDER — OXYBUTYNIN CHLORIDE 5 MG PO TABS
5.0000 mg | ORAL_TABLET | Freq: Three times a day (TID) | ORAL | 3 refills | Status: AC | PRN
Start: 2022-05-06 — End: 2023-05-06

## 2022-05-06 MED ORDER — PSEUDOEPHEDRINE HCL ER 120 MG PO TB12: 120.0000 mg | ORAL_TABLET | Freq: Two times a day (BID) | ORAL | 1 refills | Status: AC | PRN

## 2022-05-06 NOTE — Patient Instructions (Signed)
  Gluten free trial for 4-6 weeks. OK to use gluten-free bread and gluten-free pasta.    Gluten-Free Diet for Celiac Disease, Adult The gluten-free diet includes all foods that do not contain gluten. Gluten is a protein that is found in wheat, rye, barley, and some other grains. Following the gluten-free diet is the only treatment for people with celiac disease. It helps to prevent damage to the intestines and improves or eliminates the symptoms of celiac disease. Following the gluten-free diet requires some planning. It can be challenging at first, but it gets easier with time and practice. There are more gluten-free options available today than ever before. If you need help finding gluten-free foods or if you have questions, talk with your diet and nutrition specialist (registered dietitian) or your health care provider. What do I need to know about a gluten-free diet?  All fruits, vegetables, and meats are safe to eat and do not contain gluten.  When grocery shopping, start by shopping in the produce, meat, and dairy sections. These sections are more likely to contain gluten-free foods. Then move to the aisles that contain packaged foods if you need to.  Read all food labels. Gluten is often added to foods. Always check the ingredient list and look for warnings, such as "may contain gluten."  Talk with your dietitian or health care provider before taking a gluten-free multivitamin or mineral supplement.  Be aware of gluten-free foods having contact with foods that contain gluten (cross-contamination). This can happen at home and with any processed foods. ? Talk with your health care provider or dietitian about how to reduce the risk of cross-contamination in your home. ? If you have questions about how a food is processed, ask the manufacturer. What key words help to identify gluten? Foods that list any of these key words on the label usually contain gluten:  Wheat, flour, enriched  flour, bromated flour, white flour, durum flour, graham flour, phosphated flour, self-rising flour, semolina, farina, barley (malt), rye, and oats.  Starch, dextrin, modified food starch, or cereal.  Thickening, fillers, or emulsifiers.  Malt flavoring, malt extract, or malt syrup.  Hydrolyzed vegetable protein.  In the [place] packaged foods that are gluten-free are required to be labeled "GF." These foods should be easy to identify and are safe to eat. In the [place] food companies are also required to list common food allergens, including wheat, on their labels. Recommended foods Grains  Amaranth, bean flours, 100% buckwheat flour, corn, millet, nut flours or nut meals, GF oats, quinoa, rice, sorghum, teff, rice wafers, pure cornmeal tortillas, popcorn, and hot cereals made from cornmeal. Hominy, rice, wild rice. Some Asian rice noodles or bean noodles. Arrowroot starch, corn bran, corn flour, corn germ, cornmeal, corn starch, potato flour, potato starch flour, and rice bran. Plain, brown, and sweet rice flours. Rice polish, soy flour, and tapioca starch. Vegetables  All plain fresh, frozen, and canned vegetables. Fruits  All plain fresh, frozen, canned, and dried fruits, and 100% fruit juices. Meats and other protein foods  All fresh beef, pork, poultry, fish, seafood, and eggs. Fish canned in water, oil, brine, or vegetable broth. Plain nuts and seeds, peanut butter. Some lunch meat and some frankfurters. Dried beans, dried peas, and lentils. Dairy  Fresh plain, dry, evaporated, or condensed milk. Cream, butter, sour cream, whipping cream, and most yogurts. Unprocessed cheese, most processed cheeses, some cottage cheese, some cream cheeses. Beverages  Coffee, tea, most herbal teas. Carbonated beverages and some root beers.   Wine, sake, and distilled spirits, such as gin, vodka, and whiskey. Most hard ciders. Fats and oils  Butter, margarine, vegetable oil, hydrogenated butter, olive  oil, shortening, lard, cream, and some mayonnaise. Some commercial salad dressings. Olives. Sweets and desserts  Sugar, honey, some syrups, molasses, jelly, and jam. Plain hard candy, marshmallows, and gumdrops. Pure cocoa powder. Plain chocolate. Custard and some pudding mixes. Gelatin desserts, sorbets, frozen ice pops, and sherbet. Cake, cookies, and other desserts prepared with allowed flours. Some commercial ice creams. Cornstarch, tapioca, and rice puddings. Seasoning and other foods  Some canned or frozen soups. Monosodium glutamate (MSG). Cider, rice, and wine vinegar. Baking soda and baking powder. Cream of tartar. Baking and nutritional yeast. Certain soy sauces made without wheat (ask your dietitian about specific brands that are allowed). Nuts, coconut, and chocolate. Salt, pepper, herbs, spices, flavoring extracts, imitation or artificial flavorings, natural flavorings, and food colorings. Some medicines and supplements. Some lip glosses and other cosmetics. Rice syrups. The items listed may not be a complete list. Talk with your dietitian about what dietary choices are best for you. Foods to avoid Grains  Barley, bran, bulgur, couscous, cracked wheat, [place], farro, graham, malt, matzo, semolina, wheat germ, and all wheat and rye cereals including spelt and kamut. Cereals containing malt as a flavoring, such as rice cereal. Noodles, spaghetti, macaroni, most packaged rice mixes, and all mixes containing wheat, rye, barley, or triticale. Vegetables  Most creamed vegetables and most vegetables canned in sauces. Some commercially prepared vegetables and salads. Fruits  Thickened or prepared fruits and some pie fillings. Some fruit snacks and fruit roll-ups. Meats and other protein foods  Any meat or meat alternative containing wheat, rye, barley, or gluten stabilizers. These are often marinated or packaged meats and lunch meats. Bread-containing products, such as Swiss steak,  croquettes, meatballs, and meatloaf. Most tuna canned in vegetable broth and turkey with hydrolyzed vegetable protein (HVP) injected as part of the basting. Seitan. Imitation fish. Eggs in sauces made from ingredients to avoid. Dairy  Commercial chocolate milk drinks and malted milk. Some non-dairy creamers. Any cheese product containing ingredients to avoid. Beverages  Certain cereal beverages. Beer, ale, malted milk, and some root beers. Some hard ciders. Some instant flavored coffees. Some herbal teas made with barley or with barley malt added. Fats and oils  Some commercial salad dressings. Sour cream containing modified food starch. Sweets and desserts  Some toffees. Chocolate-coated nuts (may be rolled in wheat flour) and some commercial candies and candy bars. Most cakes, cookies, donuts, pastries, and other baked goods. Some commercial ice cream. Ice cream cones. Commercially prepared mixes for cakes, cookies, and other desserts. Bread pudding and other puddings thickened with flour. Products containing brown rice syrup made with barley malt enzyme. Desserts and sweets made with malt flavoring. Seasoning and other foods  Some curry powders, some dry seasoning mixes, some gravy extracts, some meat sauces, some ketchups, some prepared mustards, and horseradish. Certain soy sauces. Malt vinegar. Bouillon and bouillon cubes that contain HVP. Some chip dips, and some chewing gum. Yeast extract. Brewer's yeast. Caramel color. Some medicines and supplements. Some lip glosses and other cosmetics. The items listed may not be a complete list. Talk with your dietitian about what dietary choices are best for you. Summary  Gluten is a protein that is found in wheat, rye, barley, and some other grains. The gluten-free diet includes all foods that do not contain gluten.  If you need help finding gluten-free foods or if   you have questions, talk with your diet and nutrition specialist (registered  dietitian) or your health care provider.  Read all food labels. Gluten is often added to foods. Always check the ingredient list and look for warnings, such as "may contain gluten." This information is not intended to replace advice given to you by your health care provider. Make sure you discuss any questions you have with your health care provider. Document Released: 09/06/2005 Document Revised: 06/21/2016 Document Reviewed: 06/21/2016 Elsevier Interactive Patient Education  2018 [employer]   

## 2022-05-06 NOTE — Assessment & Plan Note (Signed)

## 2022-05-06 NOTE — Assessment & Plan Note (Signed)
On Vit D 

## 2022-05-06 NOTE — Progress Notes (Signed)
Subjective:  Patient ID: Katie Mclaughlin, female    DOB: [birth year]  Age: 51 y.o. MRN: [id number]  CC: No chief complaint on file.   HPI Katie Mclaughlin presents for a well exam  C/o rash  Outpatient Medications Prior to Visit  Medication Sig Dispense Refill   Cholecalciferol (VITAMIN D3) 125 MCG (5000 UT) TABS Take 5,000 Units by mouth daily.      ferrous sulfate 325 (65 FE) MG tablet Take 650 mg by mouth daily.      ibuprofen (ADVIL) 600 MG tablet take 1 tablet po pc every 6 hours for 5 days then prn-post operative pain 30 tablet 1   influenza vac split quadrivalent PF (FLUARIX) 0.5 ML injection Inject 0.5 mLs into the muscle.     Naphazoline HCl (CLEAR EYES OP) Place 1 drop into both eyes daily as needed (dry eyes).     oxybutynin (DITROPAN XL) 5 MG 24 hr tablet take 1 tablet po tid prn for bladder spasms 21 tablet 0   oxyCODONE-acetaminophen (PERCOCET/ROXICET) 5-325 MG tablet take 1-2 tablet po every 6 hours as needed for breakthrough post operative pain 28 tablet 0   rosuvastatin (CRESTOR) 20 MG tablet Take 1 tablet (20 mg total) by mouth daily. 30 tablet 2   No facility-administered medications prior to visit.    ROS: Review of Systems  Constitutional:  Negative for activity change, appetite change, chills, fatigue and unexpected weight change.  HENT:  Negative for congestion, mouth sores and sinus pressure.   Eyes:  Negative for visual disturbance.  Respiratory:  Negative for cough and chest tightness.   Gastrointestinal:  Negative for abdominal pain and nausea.  Genitourinary:  Negative for difficulty urinating, frequency and vaginal pain.  Musculoskeletal:  Negative for back pain and gait problem.  Skin:  Positive for rash. Negative for pallor.  Neurological:  Negative for dizziness, tremors, weakness, numbness and headaches.  Psychiatric/Behavioral:  Negative for confusion and sleep disturbance.     Objective:  BP 130/82 (BP Location: Left Arm, Patient Position:  Sitting, Cuff Size: Large)   Pulse 95   Temp 99.3 F (37.4 C) (Oral)   Ht '5\' 3"'$  (1.6 m)   Wt 198 lb (89.8 kg)   LMP 03/25/2020   SpO2 96%   BMI 35.07 kg/m   BP Readings from Last 3 Encounters:  05/06/22 130/82  08/13/20 118/82  04/25/20 121/73    Wt Readings from Last 3 Encounters:  05/06/22 198 lb (89.8 kg)  08/13/20 188 lb 12.8 oz (85.6 kg)  04/21/20 190 lb (86.2 kg)    Physical Exam Constitutional:      General: She is not in acute distress.    Appearance: She is well-developed. She is obese.  HENT:     Head: Normocephalic.     Right Ear: External ear normal.     Left Ear: External ear normal.     Nose: Nose normal.  Eyes:     General:        Right eye: No discharge.        Left eye: No discharge.     Conjunctiva/sclera: Conjunctivae normal.     Pupils: Pupils are equal, round, and reactive to light.  Neck:     Thyroid: No thyromegaly.     Vascular: No JVD.     Trachea: No tracheal deviation.  Cardiovascular:     Rate and Rhythm: Normal rate and regular rhythm.     Heart sounds: Normal heart sounds.  Pulmonary:  Effort: No respiratory distress.     Breath sounds: No stridor. No wheezing.  Abdominal:     General: Bowel sounds are normal. There is no distension.     Palpations: Abdomen is soft. There is no mass.     Tenderness: There is no abdominal tenderness. There is no guarding or rebound.  Musculoskeletal:        General: No tenderness.     Cervical back: Normal range of motion and neck supple. No rigidity.  Lymphadenopathy:     Cervical: No cervical adenopathy.  Skin:    Findings: No erythema or rash.  Neurological:     Cranial Nerves: No cranial nerve deficit.     Motor: No abnormal muscle tone.     Coordination: Coordination normal.     Deep Tendon Reflexes: Reflexes normal.  Psychiatric:        Behavior: Behavior normal.        Thought Content: Thought content normal.        Judgment: Judgment normal.    Posterior neck, occipital  scalp border lump   Lab Results  Component Value Date   WBC 5.3 08/13/2020   HGB 13.7 08/13/2020   HCT 41.6 08/13/2020   PLT 205.0 08/13/2020   GLUCOSE 95 08/13/2020   CHOL 186 08/13/2020   TRIG 205.0 (H) 08/13/2020   HDL 39.00 (L) 08/13/2020   LDLDIRECT 120.0 08/13/2020   LDLCALC 155 (H) 06/07/2019   ALT 57 (H) 08/13/2020   AST 32 08/13/2020   NA 137 08/13/2020   K 3.9 08/13/2020   CL 98 08/13/2020   CREATININE 0.60 08/13/2020   BUN 12 08/13/2020   CO2 31 08/13/2020   TSH 0.93 08/13/2020    DG Chest 2 View  Result Date: 10/17/2020 CLINICAL DATA:  COVID-19 positive 3 weeks ago, cough and shortness of breath remain EXAM: CHEST - 2 VIEW COMPARISON:  None FINDINGS: Normal heart size, mediastinal contours, and pulmonary vascularity. Lungs clear. No pleural effusion or pneumothorax. Bones unremarkable. IMPRESSION: Normal exam. Electronically Signed   By: Lavonia Dana M.D.   On: 10/17/2020 18:34    Assessment & Plan:   Problem List Items Addressed This Visit     Hyperthyroidism   Mass of scalp    Posterior neck, occipital scalp border lump x 3 years CT head      Relevant Orders   CT HEAD WO CONTRAST (5MM)   Vitamin D deficiency    On Vit D      Well adult exam     We discussed age appropriate health related issues, including available/recomended screening tests and vaccinations. Labs were ordered to be later reviewed . All questions were answered. We discussed one or more of the following - seat belt use, use of sunscreen/sun exposure exercise, fall risk reduction, second hand smoke exposure, firearm use and storage, seat belt use, a need for adhering to healthy diet and exercise. Labs were ordered.  All questions were answered.        Relevant Orders   TSH   Urinalysis   CBC with Differential/Platelet   Lipid panel   Comprehensive metabolic panel   Other Visit Diagnoses     Arthralgia, unspecified joint    -  Primary   Relevant Orders   Uric acid    Rheumatoid factor   Mixed hyperlipidemia       Relevant Medications   rosuvastatin (CRESTOR) 10 MG tablet         Meds ordered this encounter  Medications   rosuvastatin (CRESTOR) 10 MG tablet    Sig: Take 1 tablet (10 mg total) by mouth daily.    Dispense:  90 tablet    Refill:  3   triamcinolone cream (KENALOG) 0.5 %    Sig: Apply 1 Application topically 3 (three) times daily.    Dispense:  90 g    Refill:  1   methylPREDNISolone (MEDROL DOSEPAK) 4 MG TBPK tablet    Sig: As directed    Dispense:  21 tablet    Refill:  0   hydrOXYzine (VISTARIL) 25 MG capsule    Sig: Take 1 capsule (25 mg total) by mouth every 8 (eight) hours as needed for itching.    Dispense:  60 capsule    Refill:  1   pseudoephedrine (SUDAFED) 120 MG 12 hr tablet    Sig: Take 1 tablet (120 mg total) by mouth every 12 (twelve) hours as needed for congestion.    Dispense:  60 tablet    Refill:  1   oxybutynin (DITROPAN) 5 MG tablet    Sig: Take 1 tablet (5 mg total) by mouth every 8 (eight) hours as needed for bladder spasms.    Dispense:  90 tablet    Refill:  3      Follow-up: Return in about 3 months (around 08/06/2022) for a follow-up visit.  Walker Kehr, MD

## 2022-05-06 NOTE — Assessment & Plan Note (Signed)
Posterior neck, occipital scalp border lump x 3 years CT head

## 2022-05-11 DIAGNOSIS — R32 Unspecified urinary incontinence: Secondary | ICD-10-CM | POA: Diagnosis not present

## 2022-05-11 DIAGNOSIS — Z1231 Encounter for screening mammogram for malignant neoplasm of breast: Secondary | ICD-10-CM | POA: Diagnosis not present

## 2022-05-11 DIAGNOSIS — Z01419 Encounter for gynecological examination (general) (routine) without abnormal findings: Secondary | ICD-10-CM | POA: Diagnosis not present

## 2022-05-11 DIAGNOSIS — Z6836 Body mass index (BMI) 36.0-36.9, adult: Secondary | ICD-10-CM | POA: Diagnosis not present

## 2022-06-03 DIAGNOSIS — H40003 Preglaucoma, unspecified, bilateral: Secondary | ICD-10-CM | POA: Insufficient documentation

## 2022-06-03 DIAGNOSIS — G43411 Hemiplegic migraine, intractable, with status migrainosus: Secondary | ICD-10-CM | POA: Diagnosis not present

## 2022-06-03 DIAGNOSIS — H04123 Dry eye syndrome of bilateral lacrimal glands: Secondary | ICD-10-CM | POA: Insufficient documentation

## 2022-06-03 DIAGNOSIS — H35711 Central serous chorioretinopathy, right eye: Secondary | ICD-10-CM | POA: Insufficient documentation

## 2022-07-28 ENCOUNTER — Other Ambulatory Visit (INDEPENDENT_AMBULATORY_CARE_PROVIDER_SITE_OTHER): Payer: BC Managed Care – PPO

## 2022-07-28 DIAGNOSIS — M2550 Pain in unspecified joint: Secondary | ICD-10-CM | POA: Diagnosis not present

## 2022-07-28 DIAGNOSIS — Z0000 Encounter for general adult medical examination without abnormal findings: Secondary | ICD-10-CM | POA: Diagnosis not present

## 2022-07-28 LAB — CBC WITH DIFFERENTIAL/PLATELET
Basophils Absolute: 0 10*3/uL (ref 0.0–0.1)
Basophils Relative: 0.6 % (ref 0.0–3.0)
Eosinophils Absolute: 0.2 10*3/uL (ref 0.0–0.7)
Eosinophils Relative: 2.7 % (ref 0.0–5.0)
HCT: 39.7 % (ref 36.0–46.0)
Hemoglobin: 13.2 g/dL (ref 12.0–15.0)
Lymphocytes Relative: 24.9 % (ref 12.0–46.0)
Lymphs Abs: 1.7 10*3/uL (ref 0.7–4.0)
MCHC: 33.4 g/dL (ref 30.0–36.0)
MCV: 89.9 fl (ref 78.0–100.0)
Monocytes Absolute: 0.5 10*3/uL (ref 0.1–1.0)
Monocytes Relative: 7.9 % (ref 3.0–12.0)
Neutro Abs: 4.4 10*3/uL (ref 1.4–7.7)
Neutrophils Relative %: 63.9 % (ref 43.0–77.0)
Platelets: 212 10*3/uL (ref 150.0–400.0)
RBC: 4.41 Mil/uL (ref 3.87–5.11)
RDW: 13.6 % (ref 11.5–15.5)
WBC: 6.9 10*3/uL (ref 4.0–10.5)

## 2022-07-28 LAB — URINALYSIS
Bilirubin Urine: NEGATIVE
Hgb urine dipstick: NEGATIVE
Ketones, ur: NEGATIVE
Leukocytes,Ua: NEGATIVE
Nitrite: NEGATIVE
Specific Gravity, Urine: <=1.005 — AB (ref 1.000–1.030)
Total Protein, Urine: NEGATIVE
Urine Glucose: NEGATIVE
Urobilinogen, UA: 0.2 (ref 0.0–1.0)
pH: 7 (ref 5.0–8.0)

## 2022-07-28 LAB — COMPREHENSIVE METABOLIC PANEL
ALT: 42 U/L — ABNORMAL HIGH (ref 0–35)
AST: 23 U/L (ref 0–37)
Albumin: 4.3 g/dL (ref 3.5–5.2)
Alkaline Phosphatase: 56 U/L (ref 39–117)
BUN: 10 mg/dL (ref 6–23)
CO2: 29 mEq/L (ref 19–32)
Calcium: 9.1 mg/dL (ref 8.4–10.5)
Chloride: 103 mEq/L (ref 96–112)
Creatinine, Ser: 0.56 mg/dL (ref 0.40–1.20)
GFR: 106.22 mL/min (ref >60.00)
Glucose, Bld: 127 mg/dL — ABNORMAL HIGH (ref 70–99)
Potassium: 4.1 mEq/L (ref 3.5–5.1)
Sodium: 138 mEq/L (ref 135–145)
Total Bilirubin: 0.4 mg/dL (ref 0.2–1.2)
Total Protein: 6.8 g/dL (ref 6.0–8.3)

## 2022-07-28 LAB — LIPID PANEL
Cholesterol: 146 mg/dL (ref 0–200)
HDL: 40.6 mg/dL (ref >39.00)
LDL Cholesterol: 82 mg/dL (ref 0–99)
NonHDL: 105.64
Total CHOL/HDL Ratio: 4
Triglycerides: 118 mg/dL (ref 0.0–149.0)
VLDL: 23.6 mg/dL (ref 0.0–40.0)

## 2022-07-28 LAB — URIC ACID: Uric Acid, Serum: 4.3 mg/dL (ref 2.4–7.0)

## 2022-07-28 LAB — TSH: TSH: 0.67 u[IU]/mL (ref 0.35–5.50)

## 2022-07-29 LAB — RHEUMATOID FACTOR: Rheumatoid fact SerPl-aCnc: <14 IU/mL (ref <14)

## 2022-08-19 DIAGNOSIS — N3941 Urge incontinence: Secondary | ICD-10-CM | POA: Diagnosis not present

## 2022-08-19 DIAGNOSIS — N820 Vesicovaginal fistula: Secondary | ICD-10-CM | POA: Diagnosis not present

## 2022-08-19 DIAGNOSIS — R81 Glycosuria: Secondary | ICD-10-CM | POA: Diagnosis not present

## 2022-09-02 DIAGNOSIS — N820 Vesicovaginal fistula: Secondary | ICD-10-CM | POA: Diagnosis not present

## 2022-09-02 DIAGNOSIS — K219 Gastro-esophageal reflux disease without esophagitis: Secondary | ICD-10-CM | POA: Diagnosis not present

## 2022-09-03 DIAGNOSIS — K219 Gastro-esophageal reflux disease without esophagitis: Secondary | ICD-10-CM | POA: Diagnosis not present

## 2022-09-03 DIAGNOSIS — N820 Vesicovaginal fistula: Secondary | ICD-10-CM | POA: Diagnosis not present

## 2022-09-17 DIAGNOSIS — Z9889 Other specified postprocedural states: Secondary | ICD-10-CM | POA: Diagnosis not present

## 2022-09-17 DIAGNOSIS — N820 Vesicovaginal fistula: Secondary | ICD-10-CM | POA: Diagnosis not present

## 2022-09-17 DIAGNOSIS — N280 Ischemia and infarction of kidney: Secondary | ICD-10-CM | POA: Diagnosis not present

## 2022-10-18 DIAGNOSIS — M25572 Pain in left ankle and joints of left foot: Secondary | ICD-10-CM | POA: Diagnosis not present

## 2022-10-18 DIAGNOSIS — M25562 Pain in left knee: Secondary | ICD-10-CM | POA: Diagnosis not present

## 2022-10-18 DIAGNOSIS — M79671 Pain in right foot: Secondary | ICD-10-CM | POA: Diagnosis not present

## 2022-11-04 DIAGNOSIS — L02426 Furuncle of left lower limb: Secondary | ICD-10-CM | POA: Diagnosis not present

## 2022-11-04 DIAGNOSIS — L02425 Furuncle of right lower limb: Secondary | ICD-10-CM | POA: Diagnosis not present

## 2022-11-04 DIAGNOSIS — L538 Other specified erythematous conditions: Secondary | ICD-10-CM | POA: Diagnosis not present

## 2022-11-04 DIAGNOSIS — Z789 Other specified health status: Secondary | ICD-10-CM | POA: Diagnosis not present

## 2022-11-08 DIAGNOSIS — M6281 Muscle weakness (generalized): Secondary | ICD-10-CM | POA: Diagnosis not present

## 2022-11-08 DIAGNOSIS — M25672 Stiffness of left ankle, not elsewhere classified: Secondary | ICD-10-CM | POA: Diagnosis not present

## 2022-11-08 DIAGNOSIS — M25671 Stiffness of right ankle, not elsewhere classified: Secondary | ICD-10-CM | POA: Diagnosis not present

## 2022-11-08 DIAGNOSIS — R262 Difficulty in walking, not elsewhere classified: Secondary | ICD-10-CM | POA: Diagnosis not present

## 2023-04-11 ENCOUNTER — Other Ambulatory Visit: Payer: Self-pay | Admitting: *Deleted

## 2023-04-11 MED ORDER — ROSUVASTATIN CALCIUM 10 MG PO TABS: 10.0000 mg | ORAL_TABLET | Freq: Every day | ORAL | 0 refills | Status: DC

## 2023-07-14 ENCOUNTER — Encounter: Payer: Self-pay | Admitting: Internal Medicine

## 2023-07-14 ENCOUNTER — Ambulatory Visit (INDEPENDENT_AMBULATORY_CARE_PROVIDER_SITE_OTHER): Payer: BC Managed Care – PPO | Admitting: Internal Medicine

## 2023-07-14 VITALS — BP 118/80 | HR 83 | Temp 98.6°F | Ht 63.0 in | Wt 197.0 lb

## 2023-07-14 DIAGNOSIS — Z0000 Encounter for general adult medical examination without abnormal findings: Secondary | ICD-10-CM

## 2023-07-14 DIAGNOSIS — R739 Hyperglycemia, unspecified: Secondary | ICD-10-CM | POA: Diagnosis not present

## 2023-07-14 DIAGNOSIS — N9981 Other intraoperative complications of genitourinary system: Secondary | ICD-10-CM | POA: Diagnosis not present

## 2023-07-14 DIAGNOSIS — E559 Vitamin D deficiency, unspecified: Secondary | ICD-10-CM

## 2023-07-14 DIAGNOSIS — M2550 Pain in unspecified joint: Secondary | ICD-10-CM | POA: Diagnosis not present

## 2023-07-14 DIAGNOSIS — Z1231 Encounter for screening mammogram for malignant neoplasm of breast: Secondary | ICD-10-CM | POA: Diagnosis not present

## 2023-07-14 DIAGNOSIS — R232 Flushing: Secondary | ICD-10-CM | POA: Diagnosis not present

## 2023-07-14 DIAGNOSIS — Z1211 Encounter for screening for malignant neoplasm of colon: Secondary | ICD-10-CM

## 2023-07-14 LAB — CBC WITH DIFFERENTIAL/PLATELET
Basophils Absolute: 0.1 10*3/uL (ref 0.0–0.1)
Basophils Relative: 0.6 % (ref 0.0–3.0)
Eosinophils Absolute: 0.1 10*3/uL (ref 0.0–0.7)
Eosinophils Relative: 1.8 % (ref 0.0–5.0)
HCT: 43 % (ref 36.0–46.0)
Hemoglobin: 13.9 g/dL (ref 12.0–15.0)
Lymphocytes Relative: 30 % (ref 12.0–46.0)
Lymphs Abs: 2.5 10*3/uL (ref 0.7–4.0)
MCHC: 32.3 g/dL (ref 30.0–36.0)
MCV: 90.3 fL (ref 78.0–100.0)
Monocytes Absolute: 0.6 10*3/uL (ref 0.1–1.0)
Monocytes Relative: 7.8 % (ref 3.0–12.0)
Neutro Abs: 4.9 10*3/uL (ref 1.4–7.7)
Neutrophils Relative %: 59.8 % (ref 43.0–77.0)
Platelets: 225 10*3/uL (ref 150.0–400.0)
RBC: 4.76 Mil/uL (ref 3.87–5.11)
RDW: 13.2 % (ref 11.5–15.5)
WBC: 8.2 10*3/uL (ref 4.0–10.5)

## 2023-07-14 LAB — COMPREHENSIVE METABOLIC PANEL
ALT: 35 U/L (ref 0–35)
AST: 21 U/L (ref 0–37)
Albumin: 4.8 g/dL (ref 3.5–5.2)
Alkaline Phosphatase: 69 U/L (ref 39–117)
BUN: 12 mg/dL (ref 6–23)
CO2: 33 meq/L — ABNORMAL HIGH (ref 19–32)
Calcium: 10.2 mg/dL (ref 8.4–10.5)
Chloride: 102 meq/L (ref 96–112)
Creatinine, Ser: 0.68 mg/dL (ref 0.40–1.20)
GFR: 100.69 mL/min (ref >60.00)
Glucose, Bld: 78 mg/dL (ref 70–99)
Potassium: 4.4 meq/L (ref 3.5–5.1)
Sodium: 142 meq/L (ref 135–145)
Total Bilirubin: 0.4 mg/dL (ref 0.2–1.2)
Total Protein: 7.7 g/dL (ref 6.0–8.3)

## 2023-07-14 LAB — VITAMIN B12: Vitamin B-12: 464 pg/mL (ref 211–911)

## 2023-07-14 LAB — LIPID PANEL
Cholesterol: 149 mg/dL (ref 0–200)
HDL: 44.8 mg/dL (ref >39.00)
LDL Cholesterol: 67 mg/dL (ref 0–99)
NonHDL: 104.46
Total CHOL/HDL Ratio: 3
Triglycerides: 185 mg/dL — ABNORMAL HIGH (ref 0.0–149.0)
VLDL: 37 mg/dL (ref 0.0–40.0)

## 2023-07-14 LAB — URINALYSIS
Bilirubin Urine: NEGATIVE
Hgb urine dipstick: NEGATIVE
Ketones, ur: NEGATIVE
Leukocytes,Ua: NEGATIVE
Nitrite: NEGATIVE
Specific Gravity, Urine: 1.02 (ref 1.000–1.030)
Total Protein, Urine: NEGATIVE
Urine Glucose: NEGATIVE
Urobilinogen, UA: 0.2 (ref 0.0–1.0)
pH: 7 (ref 5.0–8.0)

## 2023-07-14 LAB — VITAMIN D 25 HYDROXY (VIT D DEFICIENCY, FRACTURES): VITD: 30.9 ng/mL (ref 30.00–100.00)

## 2023-07-14 LAB — FOLLICLE STIMULATING HORMONE: FSH: 79 m[IU]/mL

## 2023-07-14 LAB — TSH: TSH: 0.96 u[IU]/mL (ref 0.35–5.50)

## 2023-07-14 LAB — T4, FREE: Free T4: 0.83 ng/dL (ref 0.60–1.60)

## 2023-07-14 LAB — HEMOGLOBIN A1C: Hgb A1c MFr Bld: 6.6 % — ABNORMAL HIGH (ref 4.6–6.5)

## 2023-07-14 LAB — URIC ACID: Uric Acid, Serum: 4.8 mg/dL (ref 2.4–7.0)

## 2023-07-14 MED ORDER — SILVER SULFADIAZINE 1 % EX CREA: 1.0000 | TOPICAL_CREAM | Freq: Every day | CUTANEOUS | 1 refills | Status: DC

## 2023-07-14 MED ORDER — FISH OIL 1000 MG PO CAPS: 2.0000 | ORAL_CAPSULE | Freq: Every day | ORAL | Status: AC

## 2023-07-14 MED ORDER — LORATADINE 10 MG PO TABS: 10.0000 mg | ORAL_TABLET | Freq: Every day | ORAL | 3 refills | Status: AC

## 2023-07-14 MED ORDER — PANTOPRAZOLE SODIUM 40 MG PO TBEC: 40.0000 mg | DELAYED_RELEASE_TABLET | Freq: Every day | ORAL | 3 refills | Status: DC

## 2023-07-14 NOTE — Assessment & Plan Note (Signed)
On Vit D 

## 2023-07-14 NOTE — Assessment & Plan Note (Signed)
D/c Crestor Labs

## 2023-07-14 NOTE — Assessment & Plan Note (Signed)
Check A1c. 

## 2023-07-14 NOTE — Assessment & Plan Note (Signed)
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 I did this transvaginally. Her left ureter was very close to the fistula and required intraoperative stenting. She has done extremely well since surgery and reports being delighted with having no leakage of urine from the vagina.

## 2023-07-14 NOTE — Progress Notes (Signed)
Subjective:  Patient ID: Katie Mclaughlin, female    DOB: 12/12/1970  Age: 52 y.o. MRN: [id number]  CC: No chief complaint on file.   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 I did this transvaginally. Her left ureter was very close to the fistula and required intraoperative stenting. She has done extremely well since surgery and reports being delighted with having no leakage of urine from the vagina.  C/o hand arthritis, GERD, Wt gain   Outpatient Medications Prior to Visit  Medication Sig Dispense Refill   Cholecalciferol (VITAMIN D3) 125 MCG (5000 UT) TABS Take 5,000 Units by mouth daily.      ferrous sulfate 325 (65 FE) MG tablet Take 650 mg by mouth daily.      hydrOXYzine (VISTARIL) 25 MG capsule Take 1 capsule (25 mg total) by mouth every 8 (eight) hours as needed for itching. 60 capsule 1   ibuprofen (ADVIL) 600 MG tablet take 1 tablet po pc every 6 hours for 5 days then prn-post operative pain 30 tablet 1   influenza vac split quadrivalent PF (FLUARIX) 0.5 ML injection Inject 0.5 mLs into the muscle.     Naphazoline HCl (CLEAR EYES OP) Place 1 drop into both eyes daily as needed (dry eyes).     methylPREDNISolone (MEDROL DOSEPAK) 4 MG TBPK tablet As directed 21 tablet 0   rosuvastatin (CRESTOR) 10 MG tablet Take 1 tablet (10 mg total) by mouth daily. Annual appt due in Nov must see provider for future refills 90 tablet 0   No facility-administered medications prior to visit.    ROS: Review of Systems  Constitutional:  Negative for activity change, appetite change, chills, fatigue and unexpected weight change.  HENT:  Negative for congestion, mouth sores and sinus pressure.   Eyes:  Negative for visual disturbance.  Respiratory:  Negative for cough and chest tightness.   Gastrointestinal:  Negative for abdominal pain and nausea.  Genitourinary:  Negative for  difficulty urinating, frequency and vaginal pain.  Musculoskeletal:  Negative for back pain and gait problem.  Skin:  Negative for pallor and rash.  Neurological:  Negative for dizziness, tremors, weakness, numbness and headaches.  Psychiatric/Behavioral:  Negative for confusion, sleep disturbance and suicidal ideas.     Objective:  BP 118/80 (BP Location: Right Arm, Patient Position: Sitting, Cuff Size: Normal)   Pulse 83   Temp 98.6 F (37 C) (Oral)   Ht 5\' 3"  (1.6 m)   Wt 197 lb (89.4 kg)   LMP 03/25/2020   SpO2 95%   BMI 34.90 kg/m   BP Readings from Last 3 Encounters:  07/14/23 118/80  05/06/22 130/82  08/13/20 118/82    Wt Readings from Last 3 Encounters:  07/14/23 197 lb (89.4 kg)  05/06/22 198 lb (89.8 kg)  08/13/20 188 lb 12.8 oz (85.6 kg)    Physical Exam Constitutional:      General: She is not in acute distress.    Appearance: She is well-developed. She is obese.  HENT:     Head: Normocephalic.     Right Ear: External ear normal.     Left Ear: External ear normal.     Nose: Nose normal.  Eyes:     General:        Right eye: No discharge.        Left eye: No discharge.     Conjunctiva/sclera:  Conjunctivae normal.     Pupils: Pupils are equal, round, and reactive to light.  Neck:     Thyroid: No thyromegaly.     Vascular: No JVD.     Trachea: No tracheal deviation.  Cardiovascular:     Rate and Rhythm: Normal rate and regular rhythm.     Heart sounds: Normal heart sounds.  Pulmonary:     Effort: No respiratory distress.     Breath sounds: No stridor. No wheezing.  Abdominal:     General: Bowel sounds are normal. There is no distension.     Palpations: Abdomen is soft. There is no mass.     Tenderness: There is no abdominal tenderness. There is no guarding or rebound.  Musculoskeletal:        General: No tenderness.     Cervical back: Normal range of motion and neck supple. No rigidity.  Lymphadenopathy:     Cervical: No cervical adenopathy.   Skin:    Findings: No erythema or rash.  Neurological:     Cranial Nerves: No cranial nerve deficit.     Motor: No abnormal muscle tone.     Coordination: Coordination normal.     Deep Tendon Reflexes: Reflexes normal.  Psychiatric:        Behavior: Behavior normal.        Thought Content: Thought content normal.        Judgment: Judgment normal.     Lab Results  Component Value Date   WBC 6.9 07/28/2022   HGB 13.2 07/28/2022   HCT 39.7 07/28/2022   PLT 212.0 07/28/2022   GLUCOSE 127 (H) 07/28/2022   CHOL 146 07/28/2022   TRIG 118.0 07/28/2022   HDL 40.60 07/28/2022   LDLDIRECT 120.0 08/13/2020   LDLCALC 82 07/28/2022   ALT 42 (H) 07/28/2022   AST 23 07/28/2022   NA 138 07/28/2022   K 4.1 07/28/2022   CL 103 07/28/2022   CREATININE 0.56 07/28/2022   BUN 10 07/28/2022   CO2 29 07/28/2022   TSH 0.67 07/28/2022    DG Chest 2 View  Result Date: 10/17/2020 CLINICAL DATA:  COVID-19 positive 3 weeks ago, cough and shortness of breath remain EXAM: CHEST - 2 VIEW COMPARISON:  None FINDINGS: Normal heart size, mediastinal contours, and pulmonary vascularity. Lungs clear. No pleural effusion or pneumothorax. Bones unremarkable. IMPRESSION: Normal exam. Electronically Signed   By: Ulyses Southward M.D.   On: 10/17/2020 18:34    Assessment & Plan:   Problem List Items Addressed This Visit     Well adult exam - Primary    Coronary calcium CT score of 0.5 Agatston units - 2021. We discussed age appropriate health related issues, including available/recomended screening tests and vaccinations. Labs were ordered to be later reviewed . All questions were answered. We discussed one or more of the following - seat belt use, use of sunscreen/sun exposure exercise, fall risk reduction, second hand smoke exposure, firearm use and storage, seat belt use, a need for adhering to healthy diet and exercise. Labs were ordered.  All questions were answered. 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 I did this transvaginally. Her left ureter was very close to the fistula and required intraoperative stenting. She has done extremely well since surgery and reports being delighted with having no leakage of urine from the vagina.       Hot flashes    Options to treat discussed      Relevant Orders   Vitamin B12   VITAMIN D 25 Hydroxy (Vit-D Deficiency, Fractures)   Iron, TIBC and Ferritin Panel   FSH   T4, free   Hyperglycemia    Check A1c      Relevant Orders   Hemoglobin A1c   Arthralgia    D/c Crestor Labs      Relevant Orders   Uric acid   Other Visit Diagnoses     Screening for colon cancer       Relevant Orders   Cologuard         Meds ordered this encounter  Medications   Omega-3 Fatty Acids (FISH OIL) 1000 MG CAPS    Sig: Take 2 capsules (2,000 mg total) by mouth daily.   loratadine (CLARITIN) 10 MG tablet    Sig: Take 1 tablet (10 mg total) by mouth daily.    Dispense:  100 tablet    Refill:  3   pantoprazole (PROTONIX) 40 MG tablet    Sig: Take 1 tablet (40 mg total) by mouth daily.    Dispense:  90 tablet    Refill:  3   silver sulfADIAZINE (SILVADENE) 1 % cream    Sig: Apply 1 Application topically daily.    Dispense:  50 g    Refill:  1      Follow-up: Return in about 3 months (around 10/14/2023) for a follow-up visit.  Sonda Primes, MD

## 2023-07-14 NOTE — Assessment & Plan Note (Signed)
Options to treat discussed 

## 2023-07-14 NOTE — Assessment & Plan Note (Signed)
Coronary calcium CT score of 0.5 Agatston units - 2021. We discussed age appropriate health related issues, including available/recomended screening tests and vaccinations. Labs were ordered to be later reviewed . All questions were answered. We discussed one or more of the following - seat belt use, use of sunscreen/sun exposure exercise, fall risk reduction, second hand smoke exposure, firearm use and storage, seat belt use, a need for adhering to healthy diet and exercise. Labs were ordered.  All questions were answered. Cologuard 2024

## 2023-07-15 LAB — IRON,TIBC AND FERRITIN PANEL
%SAT: 24 % (ref 16–45)
Ferritin: 52 ng/mL (ref 16–232)
Iron: 95 ug/dL (ref 45–160)
TIBC: 392 ug/dL (ref 250–450)

## 2023-07-17 ENCOUNTER — Other Ambulatory Visit: Payer: Self-pay | Admitting: Internal Medicine

## 2023-07-18 MED ORDER — METFORMIN HCL ER 500 MG PO TB24: 500.0000 mg | ORAL_TABLET | Freq: Every day | ORAL | 5 refills | Status: DC

## 2023-07-29 LAB — COLOGUARD

## 2023-08-08 LAB — COLOGUARD

## 2023-08-09 ENCOUNTER — Encounter: Payer: Self-pay | Admitting: Internal Medicine

## 2023-08-09 ENCOUNTER — Other Ambulatory Visit: Payer: Self-pay | Admitting: Internal Medicine

## 2023-08-09 MED ORDER — PANTOPRAZOLE SODIUM 40 MG PO TBEC: 40.0000 mg | DELAYED_RELEASE_TABLET | Freq: Every day | ORAL | 3 refills | Status: AC

## 2023-08-09 MED ORDER — METFORMIN HCL ER 500 MG PO TB24
500.0000 mg | ORAL_TABLET | Freq: Every day | ORAL | 3 refills | Status: AC
Start: 2023-08-09 — End: ?

## 2023-08-22 DIAGNOSIS — Z1211 Encounter for screening for malignant neoplasm of colon: Secondary | ICD-10-CM | POA: Diagnosis not present

## 2023-08-23 ENCOUNTER — Ambulatory Visit: Payer: BC Managed Care – PPO | Admitting: Internal Medicine

## 2023-08-23 ENCOUNTER — Encounter: Payer: Self-pay | Admitting: Internal Medicine

## 2023-08-23 VITALS — BP 124/72 | HR 95 | Temp 98.3°F | Ht 63.0 in | Wt 203.0 lb

## 2023-08-23 DIAGNOSIS — E559 Vitamin D deficiency, unspecified: Secondary | ICD-10-CM

## 2023-08-23 DIAGNOSIS — E0590 Thyrotoxicosis, unspecified without thyrotoxic crisis or storm: Secondary | ICD-10-CM | POA: Diagnosis not present

## 2023-08-23 DIAGNOSIS — G5712 Meralgia paresthetica, left lower limb: Secondary | ICD-10-CM

## 2023-08-23 DIAGNOSIS — E119 Type 2 diabetes mellitus without complications: Secondary | ICD-10-CM | POA: Diagnosis not present

## 2023-08-23 DIAGNOSIS — Z7985 Long-term (current) use of injectable non-insulin antidiabetic drugs: Secondary | ICD-10-CM

## 2023-08-23 DIAGNOSIS — G5710 Meralgia paresthetica, unspecified lower limb: Secondary | ICD-10-CM | POA: Insufficient documentation

## 2023-08-23 MED ORDER — TIRZEPATIDE 2.5 MG/0.5ML ~~LOC~~ SOAJ: 2.5000 mg | SUBCUTANEOUS | 3 refills | Status: DC

## 2023-08-23 NOTE — Progress Notes (Signed)
Subjective:  Patient ID: Katie Mclaughlin, female    DOB: 19-Dec-1970  Age: 52 y.o. MRN: [id number]  CC: Medical Management of Chronic Issues (Check up )   HPI Taraann Torelli presents for DM, GERD, obesity C/o burning on the L thigh  Outpatient Medications Prior to Visit  Medication Sig Dispense Refill  . Cholecalciferol (VITAMIN D3) 125 MCG (5000 UT) TABS Take 5,000 Units by mouth daily.     Marland Kitchen loratadine (CLARITIN) 10 MG tablet Take 1 tablet (10 mg total) by mouth daily. 100 tablet 3  . metFORMIN (GLUCOPHAGE-XR) 500 MG 24 hr tablet Take 1 tablet (500 mg total) by mouth daily with breakfast. 90 tablet 3  . Naphazoline HCl (CLEAR EYES OP) Place 1 drop into both eyes daily as needed (dry eyes).    . Omega-3 Fatty Acids (FISH OIL) 1000 MG CAPS Take 2 capsules (2,000 mg total) by mouth daily.    . pantoprazole (PROTONIX) 40 MG tablet Take 1 tablet (40 mg total) by mouth daily. 90 tablet 3   No facility-administered medications prior to visit.    ROS: Review of Systems  Constitutional:  Positive for unexpected weight change. Negative for activity change, appetite change, chills and fatigue.  HENT:  Negative for congestion, mouth sores and sinus pressure.   Eyes:  Negative for visual disturbance.  Respiratory:  Negative for cough and chest tightness.   Gastrointestinal:  Negative for abdominal pain and nausea.  Genitourinary:  Negative for difficulty urinating, frequency and vaginal pain.  Musculoskeletal:  Negative for back pain and gait problem.  Skin:  Negative for pallor and rash.  Neurological:  Negative for dizziness, tremors, weakness, numbness and headaches.  Psychiatric/Behavioral:  Negative for confusion and sleep disturbance.     Objective:  BP 124/72 (BP Location: Right Arm, Patient Position: Sitting, Cuff Size: Normal)   Pulse 95   Temp 98.3 F (36.8 C) (Oral)   Ht 5\' 3"  (1.6 m)   Wt 203 lb (92.1 kg)   LMP 03/25/2020   SpO2 99%   BMI 35.96 kg/m   BP Readings  from Last 3 Encounters:  08/23/23 124/72  07/14/23 118/80  05/06/22 130/82    Wt Readings from Last 3 Encounters:  08/23/23 203 lb (92.1 kg)  07/14/23 197 lb (89.4 kg)  05/06/22 198 lb (89.8 kg)    Physical Exam Constitutional:      General: She is not in acute distress.    Appearance: She is well-developed.  HENT:     Head: Normocephalic.     Right Ear: External ear normal.     Left Ear: External ear normal.     Nose: Nose normal.  Eyes:     General:        Right eye: No discharge.        Left eye: No discharge.     Conjunctiva/sclera: Conjunctivae normal.     Pupils: Pupils are equal, round, and reactive to light.  Neck:     Thyroid: No thyromegaly.     Vascular: No JVD.     Trachea: No tracheal deviation.  Cardiovascular:     Rate and Rhythm: Normal rate and regular rhythm.     Heart sounds: Normal heart sounds.  Pulmonary:     Effort: No respiratory distress.     Breath sounds: No stridor. No wheezing.  Abdominal:     General: Bowel sounds are normal. There is no distension.     Palpations: Abdomen is soft. There is no mass.  Tenderness: There is no abdominal tenderness. There is no guarding or rebound.  Musculoskeletal:        General: No tenderness.     Cervical back: Normal range of motion and neck supple. No rigidity.  Lymphadenopathy:     Cervical: No cervical adenopathy.  Skin:    Findings: No erythema or rash.  Neurological:     Cranial Nerves: No cranial nerve deficit.     Motor: No abnormal muscle tone.     Coordination: Coordination normal.     Deep Tendon Reflexes: Reflexes normal.  Psychiatric:        Behavior: Behavior normal.        Thought Content: Thought content normal.        Judgment: Judgment normal.    Lab Results  Component Value Date   WBC 8.2 07/14/2023   HGB 13.9 07/14/2023   HCT 43.0 07/14/2023   PLT 225.0 07/14/2023   GLUCOSE 78 07/14/2023   CHOL 149 07/14/2023   TRIG 185.0 (H) 07/14/2023   HDL 44.80 07/14/2023    LDLDIRECT 120.0 08/13/2020   LDLCALC 67 07/14/2023   ALT 35 07/14/2023   AST 21 07/14/2023   NA 142 07/14/2023   K 4.4 07/14/2023   CL 102 07/14/2023   CREATININE 0.68 07/14/2023   BUN 12 07/14/2023   CO2 33 (H) 07/14/2023   TSH 0.96 07/14/2023   HGBA1C 6.6 (H) 07/14/2023    DG Chest 2 View  Result Date: 10/17/2020 CLINICAL DATA:  COVID-19 positive 3 weeks ago, cough and shortness of breath remain EXAM: CHEST - 2 VIEW COMPARISON:  None FINDINGS: Normal heart size, mediastinal contours, and pulmonary vascularity. Lungs clear. No pleural effusion or pneumothorax. Bones unremarkable. IMPRESSION: Normal exam. Electronically Signed   By: Ulyses Southward M.D.   On: 10/17/2020 18:34    Assessment & Plan:   Problem List Items Addressed This Visit     Hyperthyroidism    Remote -  Labs      Vitamin D deficiency    On Vit D      Diabetes mellitus type 2 in nonobese [facility]) - Primary    New Start Mounjaro        Relevant Medications   tirzepatide (MOUNJARO) 2.5 MG/0.5ML Pen   Other Relevant Orders   Comprehensive metabolic panel   Hemoglobin A1c   Meralgia paresthetica    L - Blue-Emu cream was recommended to use 2-3 times a day         Meds ordered this encounter  Medications  . tirzepatide [facility]) 2.5 MG/0.5ML Pen    Sig: Inject 2.5 mg into the skin once a week.    Dispense:  6 mL    Refill:  3      Follow-up: Return in about 3 months (around 11/21/2023) for a follow-up visit.  Sonda Primes, MD

## 2023-08-23 NOTE — Patient Instructions (Signed)
Blue-Emu cream -- use 2-3 times a day ? ?

## 2023-08-23 NOTE — Assessment & Plan Note (Signed)
On Vit D 

## 2023-08-23 NOTE — Assessment & Plan Note (Signed)
L - Blue-Emu cream was recommended to use 2-3 times a day

## 2023-08-23 NOTE — Assessment & Plan Note (Signed)
Remote Labs 

## 2023-08-23 NOTE — Assessment & Plan Note (Signed)
[employer]

## 2023-08-24 ENCOUNTER — Encounter: Payer: Self-pay | Admitting: Internal Medicine

## 2023-08-26 ENCOUNTER — Other Ambulatory Visit (HOSPITAL_COMMUNITY): Payer: Self-pay

## 2023-08-27 LAB — COLOGUARD: COLOGUARD: NEGATIVE

## 2023-08-30 ENCOUNTER — Telehealth: Payer: Self-pay

## 2023-08-30 ENCOUNTER — Other Ambulatory Visit (HOSPITAL_COMMUNITY): Payer: Self-pay

## 2023-08-30 NOTE — Telephone Encounter (Signed)
Pharmacy Patient Advocate Encounter   Received notification from Pt Calls Messages that prior authorization for Katie Mclaughlin is required/requested.   Insurance verification completed.   The patient is insured through [employer] .   Per test claim: PA required; PA submitted to above mentioned insurance via CoverMyMeds Key/confirmation #/EOC [id number] Status is pending

## 2023-08-30 NOTE — Telephone Encounter (Signed)
Pharmacy Patient Advocate Encounter  Received notification from [facility] that Prior Authorization for Katie Mclaughlin has been APPROVED from 07/31/23 to 08/29/24   PA #/Case ID/Reference #: [id number]

## 2023-09-02 DIAGNOSIS — Z1331 Encounter for screening for depression: Secondary | ICD-10-CM | POA: Diagnosis not present

## 2023-09-02 DIAGNOSIS — Z01419 Encounter for gynecological examination (general) (routine) without abnormal findings: Secondary | ICD-10-CM | POA: Diagnosis not present

## 2023-09-30 ENCOUNTER — Other Ambulatory Visit: Payer: Self-pay

## 2023-09-30 MED ORDER — TIRZEPATIDE 2.5 MG/0.5ML ~~LOC~~ SOAJ: 2.5000 mg | SUBCUTANEOUS | 3 refills | Status: DC

## 2023-10-04 ENCOUNTER — Other Ambulatory Visit (HOSPITAL_COMMUNITY): Payer: Self-pay

## 2023-10-04 ENCOUNTER — Telehealth: Payer: Self-pay

## 2023-10-04 NOTE — Telephone Encounter (Signed)
 Pt is needing a PA sent in for her Katie Mclaughlin due to change in insurance and pharmacy.

## 2023-10-05 NOTE — Telephone Encounter (Signed)
 Copied from CRM [id number]. Topic: Clinical - Prescription Issue >> Oct 05, 2023  2:55 PM Marlan Silva wrote: Reason for CRM: Pharmacist Dana Duncan called and stated that the Patient wanted to know the status of the prior authorization for their tirzepatide (MOUNJARO) 2.5 MG/0.5ML Pen prescription. Pharmacist can be reached at > [phone]

## 2023-10-06 NOTE — Telephone Encounter (Signed)
LVM for patient to return call. 

## 2023-10-06 NOTE — Telephone Encounter (Signed)
Patients husband followed up in regards to this

## 2023-10-06 NOTE — Telephone Encounter (Signed)
Attempted to reach pharmacy, was placed on hold for 15 minutes then no response. Will reach out to patient to confirm insurance, as we were told the medication was approved until 08/2024

## 2023-10-07 ENCOUNTER — Other Ambulatory Visit (HOSPITAL_COMMUNITY): Payer: Self-pay

## 2023-10-07 ENCOUNTER — Telehealth: Payer: Self-pay

## 2023-10-07 NOTE — Telephone Encounter (Signed)
Placed a call to [facility] to check about the prior authorization of Mounjaro and if it could be filled at [facility]. Per the representative, the Greggory Keen will have to be filled at he specialty pharmacy [facility] per her plan.   [facility] phone number: [phone]

## 2023-10-07 NOTE — Telephone Encounter (Signed)
Patient and her husband state that the prior PA was for [facility], [facility] not [facility] the medication in stock and it has to be transferred to [facility]. [facility] is stating another PA needs to be done. Please advise

## 2023-10-08 DIAGNOSIS — R059 Cough, unspecified: Secondary | ICD-10-CM | POA: Diagnosis not present

## 2023-10-08 DIAGNOSIS — Z20822 Contact with and (suspected) exposure to covid-19: Secondary | ICD-10-CM | POA: Diagnosis not present

## 2023-10-08 DIAGNOSIS — J069 Acute upper respiratory infection, unspecified: Secondary | ICD-10-CM | POA: Diagnosis not present

## 2023-10-18 ENCOUNTER — Telehealth: Payer: Self-pay

## 2023-10-18 ENCOUNTER — Other Ambulatory Visit (HOSPITAL_COMMUNITY): Payer: Self-pay

## 2023-10-18 NOTE — Telephone Encounter (Signed)
Copied from CRM [id number]. Topic: Clinical - Prescription Issue >> Oct 18, 2023 12:37 PM Ernst Spell wrote: Reason for CRM: Sonya from [employer] called to let PCP know that the pharmacy is stating that a prior auth has to be submitted to [email or web address]  for Mounjaro. The patient's member ID# is [id number].  Sonya's CB# is : [phone]. Please advise.

## 2023-10-18 NOTE — Telephone Encounter (Signed)
Pharmacy Patient Advocate Encounter   Received notification from Pt Calls Messages that prior authorization for Mounjaro 2.5mg /0.59ml is required/requested.   Insurance verification completed.   The patient is insured through [employer] .   Per test claim: PA required; PA submitted to above mentioned insurance via Prompt PA Key/confirmation #/[id number] Status is pending

## 2023-10-20 ENCOUNTER — Other Ambulatory Visit (HOSPITAL_COMMUNITY): Payer: Self-pay

## 2023-10-20 NOTE — Telephone Encounter (Signed)
Pharmacy Patient Advocate Encounter  Received notification from [facility] that Prior Authorization for Mounjaro 2.5mg /0.68ml has been APPROVED from 10/19/23 to 10/17/24. Ran test claim, Copay is $35. This test claim was processed through [facility]- copay amounts may vary at other pharmacies due to pharmacy/plan contracts, or as the patient moves through the different stages of their insurance plan.   PA #/Case ID/Reference #: [id number]  Placed a call to the insurance, to get a 90 day supply of the [facility], it will have to come from the [facility] mail order pharmacy and only a month supply can be filled a a retail pharmacy.

## 2023-10-21 NOTE — Telephone Encounter (Signed)
 Noted! Thank you

## 2023-12-08 DIAGNOSIS — L659 Nonscarring hair loss, unspecified: Secondary | ICD-10-CM | POA: Diagnosis not present

## 2023-12-08 DIAGNOSIS — L718 Other rosacea: Secondary | ICD-10-CM | POA: Diagnosis not present

## 2023-12-08 DIAGNOSIS — L218 Other seborrheic dermatitis: Secondary | ICD-10-CM | POA: Diagnosis not present

## 2024-02-14 ENCOUNTER — Telehealth: Payer: Self-pay | Admitting: Internal Medicine

## 2024-02-14 NOTE — Telephone Encounter (Signed)
 Copied from CRM [id number]. Topic: Clinical - Medication Question >> Feb 14, 2024  9:58 AM Adonis Hoot wrote: Reason for CRM: Patient would like to know if she could have new prescription for increased dosage of monjuro .5 sent to pharmacy for her?  [facility] - [address] [place], [place] - [address]  Phone: [phone] Fax: [phone]

## 2024-02-17 MED ORDER — TIRZEPATIDE 5 MG/0.5ML ~~LOC~~ SOAJ: 5.0000 mg | SUBCUTANEOUS | 3 refills | Status: DC

## 2024-02-17 NOTE — Telephone Encounter (Signed)
 Done.  Sent a prescription for 5 mg Mounjaro.  Follow-up with me in 1-2 months.  Thank you

## 2024-06-13 ENCOUNTER — Ambulatory Visit: Admitting: Internal Medicine

## 2024-06-13 ENCOUNTER — Encounter: Payer: Self-pay | Admitting: Internal Medicine

## 2024-06-13 VITALS — BP 132/68 | HR 76 | Temp 98.0°F | Ht 63.0 in | Wt 185.2 lb

## 2024-06-13 DIAGNOSIS — E119 Type 2 diabetes mellitus without complications: Secondary | ICD-10-CM | POA: Diagnosis not present

## 2024-06-13 DIAGNOSIS — M545 Low back pain, unspecified: Secondary | ICD-10-CM

## 2024-06-13 DIAGNOSIS — R232 Flushing: Secondary | ICD-10-CM

## 2024-06-13 DIAGNOSIS — M2550 Pain in unspecified joint: Secondary | ICD-10-CM | POA: Diagnosis not present

## 2024-06-13 DIAGNOSIS — G8929 Other chronic pain: Secondary | ICD-10-CM

## 2024-06-13 DIAGNOSIS — K219 Gastro-esophageal reflux disease without esophagitis: Secondary | ICD-10-CM

## 2024-06-13 MED ORDER — ESTROGENS CONJUGATED 0.625 MG PO TABS: 0.6250 mg | ORAL_TABLET | Freq: Every day | ORAL | 11 refills | Status: DC

## 2024-06-13 MED ORDER — TIRZEPATIDE 5 MG/0.5ML ~~LOC~~ SOAJ: 5.0000 mg | SUBCUTANEOUS | 5 refills | Status: AC

## 2024-06-13 NOTE — Patient Instructions (Signed)
 VEOZAH

## 2024-06-13 NOTE — Progress Notes (Signed)
 Subjective:  Patient ID: Katie Mclaughlin, female    DOB: 04/06/1971  Age: 53 y.o. MRN: [id number]  CC: Generalized Body Aches (Body aches started one year ago and the pain is not as severe no. Pain in fingers and patient states fingers have started deformation. )   HPI Katie Mclaughlin presents for  a well exam hot flashes - severe since May 2025 C/o GERD symptoms  Outpatient Medications Prior to Visit  Medication Sig Dispense Refill   calcium  carbonate (OSCAL) 1500 (600 Ca) MG TABS tablet Take by mouth 2 (two) times daily with a meal.     Cholecalciferol (VITAMIN D3) 125 MCG (5000 UT) TABS Take 5,000 Units by mouth daily.      magnesium aspartate (MAGINEX) 615 MG tablet Take 615 mg by mouth 2 (two) times daily.     Naphazoline HCl (CLEAR EYES OP) Place 1 drop into both eyes daily as needed (dry eyes).     Omega-3 Fatty Acids (FISH OIL ) 1000 MG CAPS Take 2 capsules (2,000 mg total) by mouth daily.     thiamine (VITAMIN B-1) 100 MG tablet Take 100 mg by mouth daily.     tirzepatide  (MOUNJARO ) 5 MG/0.5ML Pen Inject 5 mg into the skin once a week. 2 mL 3   loratadine  (CLARITIN ) 10 MG tablet Take 1 tablet (10 mg total) by mouth daily. (Patient not taking: Reported on 06/13/2024) 100 tablet 3   metFORMIN  (GLUCOPHAGE -XR) 500 MG 24 hr tablet Take 1 tablet (500 mg total) by mouth daily with breakfast. (Patient not taking: Reported on 06/13/2024) 90 tablet 3   pantoprazole  (PROTONIX ) 40 MG tablet Take 1 tablet (40 mg total) by mouth daily. 90 tablet 3   No facility-administered medications prior to visit.    ROS: Review of Systems  Constitutional:  Positive for diaphoresis. Negative for activity change, appetite change, chills, fatigue and unexpected weight change.  HENT:  Negative for congestion, mouth sores and sinus pressure.   Eyes:  Negative for visual disturbance.  Respiratory:  Negative for cough and chest tightness.   Gastrointestinal:  Negative for abdominal pain and nausea.   Genitourinary:  Negative for difficulty urinating, frequency and vaginal pain.  Musculoskeletal:  Negative for back pain and gait problem.  Skin:  Negative for pallor and rash.  Neurological:  Negative for dizziness, tremors, weakness, numbness and headaches.  Hematological:  Does not bruise/bleed easily.  Psychiatric/Behavioral:  Negative for confusion and sleep disturbance.     Objective:  BP 132/68   Pulse 76   Temp 98 F (36.7 C) (Oral)   Ht 5' 3 (1.6 m)   Wt 185 lb 3.2 oz (84 kg)   LMP 03/25/2020   SpO2 97%   BMI 32.81 kg/m   BP Readings from Last 3 Encounters:  06/13/24 132/68  08/23/23 124/72  07/14/23 118/80    Wt Readings from Last 3 Encounters:  06/13/24 185 lb 3.2 oz (84 kg)  08/23/23 203 lb (92.1 kg)  07/14/23 197 lb (89.4 kg)    Physical Exam Constitutional:      General: She is not in acute distress.    Appearance: She is well-developed. She is not toxic-appearing.  HENT:     Head: Normocephalic.     Right Ear: External ear normal.     Left Ear: External ear normal.     Nose: Nose normal.  Eyes:     General:        Right eye: No discharge.  Left eye: No discharge.     Conjunctiva/sclera: Conjunctivae normal.     Pupils: Pupils are equal, round, and reactive to light.  Neck:     Thyroid : No thyromegaly.     Vascular: No JVD.     Trachea: No tracheal deviation.  Cardiovascular:     Rate and Rhythm: Normal rate and regular rhythm.     Heart sounds: Normal heart sounds.  Pulmonary:     Effort: No respiratory distress.     Breath sounds: No stridor. No wheezing.  Abdominal:     General: Bowel sounds are normal. There is no distension.     Palpations: Abdomen is soft. There is no mass.     Tenderness: There is no abdominal tenderness. There is no guarding or rebound.  Musculoskeletal:        General: No tenderness.     Cervical back: Normal range of motion and neck supple. No rigidity.  Lymphadenopathy:     Cervical: No cervical  adenopathy.  Skin:    Findings: No erythema or rash.  Neurological:     Cranial Nerves: No cranial nerve deficit.     Motor: No abnormal muscle tone.     Coordination: Coordination normal.     Deep Tendon Reflexes: Reflexes normal.  Psychiatric:        Behavior: Behavior normal.        Thought Content: Thought content normal.        Judgment: Judgment normal.     Lab Results  Component Value Date   WBC 6.3 06/14/2024   HGB 13.2 06/14/2024   HCT 39.2 06/14/2024   PLT 199.0 06/14/2024   GLUCOSE 97 06/14/2024   CHOL 214 (H) 06/14/2024   TRIG 160.0 (H) 06/14/2024   HDL 32.50 (L) 06/14/2024   LDLDIRECT 120.0 08/13/2020   LDLCALC 150 (H) 06/14/2024   ALT 17 06/14/2024   AST 13 06/14/2024   NA 142 06/14/2024   K 3.8 06/14/2024   CL 103 06/14/2024   CREATININE 0.64 06/14/2024   BUN 12 06/14/2024   CO2 29 06/14/2024   TSH 1.17 06/14/2024   HGBA1C 6.1 06/14/2024    DG Chest 2 View Result Date: 10/17/2020 CLINICAL DATA:  COVID-19 positive 3 weeks ago, cough and shortness of breath remain EXAM: CHEST - 2 VIEW COMPARISON:  None FINDINGS: Normal heart size, mediastinal contours, and pulmonary vascularity. Lungs clear. No pleural effusion or pneumothorax. Bones unremarkable. IMPRESSION: Normal exam. Electronically Signed   By: Oneil Kiss M.D.   On: 10/17/2020 18:34    Assessment & Plan:   Problem List Items Addressed This Visit     Arthralgia   Relevant Orders   Uric acid (Completed)   Diabetes mellitus type 2 in nonobese (HCC)   Increase Mounjaro  to 5 mg weekly      Relevant Medications   tirzepatide  (MOUNJARO ) 5 MG/0.5ML Pen   Other Relevant Orders   Urinalysis (Completed)   CBC with Differential/Platelet (Completed)   Lipid panel (Completed)   Comprehensive metabolic panel with GFR (Completed)   Hemoglobin A1c (Completed)   GERD (gastroesophageal reflux disease)   Worse.  Take pantoprazole  daily Potential benefits of a long term PPI use as well as potential risks   and complications were explained to the patient and were aknowledged.        Hot flashes - Primary   Severe symptoms.  Options to treat including hormonal and nonhormonal were discussed with the patient.  She would like to try Premarin .  Potential benefits of a long term HRT  use as well as potential risks (blood clots, breast cancer etc.) and complications were explained to the patient and were aknowledged.       Relevant Orders   TSH (Completed)   FSH   Low back pain   Chronic.  Improve ergonomics (a piano chair) if possible         Meds ordered this encounter  Medications   estrogens , conjugated, (PREMARIN ) 0.625 MG tablet    Sig: Take 1 tablet (0.625 mg total) by mouth daily. Take daily for 21 days then do not take for 7 days.    Dispense:  30 tablet    Refill:  11   tirzepatide  (MOUNJARO ) 5 MG/0.5ML Pen    Sig: Inject 5 mg into the skin once a week.    Dispense:  2 mL    Refill:  5      Follow-up: Return in about 3 months (around 09/12/2024) for a follow-up visit.  Marolyn Noel, MD

## 2024-06-14 ENCOUNTER — Other Ambulatory Visit (INDEPENDENT_AMBULATORY_CARE_PROVIDER_SITE_OTHER)

## 2024-06-14 DIAGNOSIS — M2550 Pain in unspecified joint: Secondary | ICD-10-CM

## 2024-06-14 DIAGNOSIS — E119 Type 2 diabetes mellitus without complications: Secondary | ICD-10-CM

## 2024-06-14 DIAGNOSIS — R232 Flushing: Secondary | ICD-10-CM

## 2024-06-14 LAB — COMPREHENSIVE METABOLIC PANEL WITH GFR
ALT: 17 U/L (ref 0–35)
AST: 13 U/L (ref 0–37)
Albumin: 4.1 g/dL (ref 3.5–5.2)
Alkaline Phosphatase: 56 U/L (ref 39–117)
BUN: 12 mg/dL (ref 6–23)
CO2: 29 meq/L (ref 19–32)
Calcium: 9.2 mg/dL (ref 8.4–10.5)
Chloride: 103 meq/L (ref 96–112)
Creatinine, Ser: 0.64 mg/dL (ref 0.40–1.20)
GFR: 101.51 mL/min (ref >60.00)
Glucose, Bld: 97 mg/dL (ref 70–99)
Potassium: 3.8 meq/L (ref 3.5–5.1)
Sodium: 142 meq/L (ref 135–145)
Total Bilirubin: 0.4 mg/dL (ref 0.2–1.2)
Total Protein: 6.8 g/dL (ref 6.0–8.3)

## 2024-06-14 LAB — URINALYSIS
Bilirubin Urine: NEGATIVE
Hgb urine dipstick: NEGATIVE
Ketones, ur: NEGATIVE
Leukocytes,Ua: NEGATIVE
Nitrite: NEGATIVE
Specific Gravity, Urine: 1.02 (ref 1.000–1.030)
Total Protein, Urine: NEGATIVE
Urine Glucose: NEGATIVE
Urobilinogen, UA: 0.2 (ref 0.0–1.0)
pH: 7 (ref 5.0–8.0)

## 2024-06-14 LAB — HEMOGLOBIN A1C: Hgb A1c MFr Bld: 6.1 % (ref 4.6–6.5)

## 2024-06-14 LAB — CBC WITH DIFFERENTIAL/PLATELET
Basophils Absolute: 0.1 K/uL (ref 0.0–0.1)
Basophils Relative: 0.9 % (ref 0.0–3.0)
Eosinophils Absolute: 0.2 K/uL (ref 0.0–0.7)
Eosinophils Relative: 3.2 % (ref 0.0–5.0)
HCT: 39.2 % (ref 36.0–46.0)
Hemoglobin: 13.2 g/dL (ref 12.0–15.0)
Lymphocytes Relative: 31.6 % (ref 12.0–46.0)
Lymphs Abs: 2 K/uL (ref 0.7–4.0)
MCHC: 33.7 g/dL (ref 30.0–36.0)
MCV: 87.9 fl (ref 78.0–100.0)
Monocytes Absolute: 0.4 K/uL (ref 0.1–1.0)
Monocytes Relative: 5.8 % (ref 3.0–12.0)
Neutro Abs: 3.7 K/uL (ref 1.4–7.7)
Neutrophils Relative %: 58.5 % (ref 43.0–77.0)
Platelets: 199 K/uL (ref 150.0–400.0)
RBC: 4.46 Mil/uL (ref 3.87–5.11)
RDW: 13.8 % (ref 11.5–15.5)
WBC: 6.3 K/uL (ref 4.0–10.5)

## 2024-06-14 LAB — LIPID PANEL
Cholesterol: 214 mg/dL — ABNORMAL HIGH (ref 0–200)
HDL: 32.5 mg/dL — ABNORMAL LOW (ref >39.00)
LDL Cholesterol: 150 mg/dL — ABNORMAL HIGH (ref 0–99)
NonHDL: 181.9
Total CHOL/HDL Ratio: 7
Triglycerides: 160 mg/dL — ABNORMAL HIGH (ref 0.0–149.0)
VLDL: 32 mg/dL (ref 0.0–40.0)

## 2024-06-14 LAB — TSH: TSH: 1.17 u[IU]/mL (ref 0.35–5.50)

## 2024-06-14 LAB — URIC ACID: Uric Acid, Serum: 5.5 mg/dL (ref 2.4–7.0)

## 2024-06-15 ENCOUNTER — Ambulatory Visit: Payer: Self-pay | Admitting: Internal Medicine

## 2024-06-18 ENCOUNTER — Telehealth: Payer: Self-pay

## 2024-06-18 NOTE — Telephone Encounter (Unsigned)
 Copied from CRM #[id number]. Topic: Clinical - Prescription Issue >> Jun 18, 2024 12:41 PM Drema MATSU wrote: Reason for CRM: [place] recieved a prescription for estrogens , conjugated, (PREMARIN ) 0.625 MG tablet is not a preferred option (not covered) and patient insurance is requesting estradiol.

## 2024-06-24 DIAGNOSIS — K219 Gastro-esophageal reflux disease without esophagitis: Secondary | ICD-10-CM | POA: Insufficient documentation

## 2024-06-24 NOTE — Assessment & Plan Note (Addendum)
 Severe symptoms.  Options to treat including hormonal and nonhormonal were discussed with the patient.  She would like to try Premarin .  Potential benefits of a long term HRT  use as well as potential risks (blood clots, breast cancer etc.) and complications were explained to the patient and were aknowledged.

## 2024-06-24 NOTE — Assessment & Plan Note (Signed)
Increase Mounjaro to 5 mg weekly.

## 2024-06-24 NOTE — Assessment & Plan Note (Signed)
 Worse.  Take pantoprazole  daily Potential benefits of a long term PPI use as well as potential risks  and complications were explained to the patient and were aknowledged.

## 2024-06-24 NOTE — Assessment & Plan Note (Signed)
 Chronic.  Improve ergonomics (a piano chair) if possible

## 2024-06-25 MED ORDER — ESTRADIOL 0.05 MG/24HR TD PTWK: 0.0500 mg | MEDICATED_PATCH | TRANSDERMAL | 12 refills | Status: AC

## 2024-06-25 NOTE — Addendum Note (Signed)
 Addended by: Andrianna Manalang V on: 06/25/2024 07:30 AM   Modules accepted: Orders

## 2024-06-25 NOTE — Telephone Encounter (Signed)
 Noted.  Try estradiol patch weekly.  See new prescription.  Thanks

## 2024-07-16 ENCOUNTER — Encounter: Admitting: Internal Medicine

## 2024-07-23 ENCOUNTER — Telehealth: Payer: Self-pay

## 2024-07-23 NOTE — Telephone Encounter (Signed)
 Please send in a PA for pts Mounjaro  due to new insurance.

## 2024-07-24 ENCOUNTER — Telehealth: Payer: Self-pay

## 2024-07-24 ENCOUNTER — Other Ambulatory Visit (HOSPITAL_COMMUNITY): Payer: Self-pay

## 2024-07-24 NOTE — Telephone Encounter (Signed)
 Pharmacy Patient Advocate Encounter  Received notification from [facility] that Prior Authorization for Mounjaro  5mg /0.27ml has been APPROVED from 07/24/24 to 07/24/27. Ran test claim, Copay is $25. This test claim was processed through [facility]- copay amounts may vary at other pharmacies due to pharmacy/plan contracts, or as the patient moves through the different stages of their insurance plan.   PA #/Case ID/Reference #: [id number]  Left a message at [facility] to notify of the approval

## 2024-07-24 NOTE — Telephone Encounter (Signed)
 Pharmacy Patient Advocate Encounter   Received notification from Pt Calls Messages that prior authorization for Mounjaro  5mg /0.69ml is required/requested.   Insurance verification completed.   The patient is insured through [facility].   Per test claim: PA required; PA submitted to above mentioned insurance via Latent Key/confirmation #/EOC [facility] Status is pending

## 2024-08-29 ENCOUNTER — Other Ambulatory Visit (HOSPITAL_COMMUNITY): Payer: Self-pay
